# Patient Record
Sex: Female | Born: 1960 | Race: Black or African American | Hispanic: No | Marital: Single | Smoking: Never smoker
Health system: Southern US, Community
[De-identification: ages and names within clinical notes are randomized; demographics above are authoritative.]

## PROBLEM LIST (undated history)

## (undated) DIAGNOSIS — C50919 Malignant neoplasm of unspecified site of unspecified female breast: Secondary | ICD-10-CM

## (undated) DIAGNOSIS — G629 Polyneuropathy, unspecified: Secondary | ICD-10-CM

## (undated) HISTORY — DX: Malignant neoplasm of unspecified site of unspecified female breast: C50.919

## (undated) HISTORY — PX: BREAST LUMPECTOMY: SHX2

## (undated) HISTORY — PX: PORTACATH PLACEMENT: SHX2246

## (undated) HISTORY — DX: Polyneuropathy, unspecified: G62.9

---

## 2001-08-12 ENCOUNTER — Encounter: Payer: Self-pay | Admitting: *Deleted

## 2001-08-12 ENCOUNTER — Encounter: Admission: RE | Admit: 2001-08-12 | Discharge: 2001-08-12 | Payer: Self-pay | Admitting: *Deleted

## 2007-07-07 ENCOUNTER — Encounter: Admission: RE | Admit: 2007-07-07 | Discharge: 2007-07-07 | Payer: Self-pay | Admitting: Obstetrics and Gynecology

## 2007-07-21 ENCOUNTER — Encounter: Admission: RE | Admit: 2007-07-21 | Discharge: 2007-07-21 | Payer: Self-pay | Admitting: Obstetrics and Gynecology

## 2008-01-19 ENCOUNTER — Encounter: Admission: RE | Admit: 2008-01-19 | Discharge: 2008-01-19 | Payer: Self-pay | Admitting: Obstetrics and Gynecology

## 2008-12-20 ENCOUNTER — Encounter: Admission: RE | Admit: 2008-12-20 | Discharge: 2008-12-20 | Payer: Self-pay | Admitting: Obstetrics and Gynecology

## 2009-10-22 HISTORY — PX: BREAST LUMPECTOMY: SHX2

## 2010-01-02 ENCOUNTER — Encounter: Admission: RE | Admit: 2010-01-02 | Discharge: 2010-01-02 | Payer: Self-pay | Admitting: Obstetrics and Gynecology

## 2010-07-22 DIAGNOSIS — C50919 Malignant neoplasm of unspecified site of unspecified female breast: Secondary | ICD-10-CM

## 2010-07-22 HISTORY — DX: Malignant neoplasm of unspecified site of unspecified female breast: C50.919

## 2010-09-04 ENCOUNTER — Encounter: Admission: RE | Admit: 2010-09-04 | Discharge: 2010-09-04 | Payer: Self-pay | Admitting: Obstetrics and Gynecology

## 2010-09-04 ENCOUNTER — Other Ambulatory Visit: Payer: Self-pay | Admitting: Diagnostic Radiology

## 2010-09-06 ENCOUNTER — Ambulatory Visit: Payer: Self-pay | Admitting: Oncology

## 2010-09-11 ENCOUNTER — Encounter: Admission: RE | Admit: 2010-09-11 | Discharge: 2010-09-11 | Payer: Self-pay | Admitting: Obstetrics and Gynecology

## 2010-09-18 LAB — CBC WITH DIFFERENTIAL/PLATELET
BASO%: 0.2 % (ref 0.0–2.0)
Eosinophils Absolute: 0 10*3/uL (ref 0.0–0.5)
MCHC: 34.2 g/dL (ref 31.5–36.0)
MONO#: 0.3 10*3/uL (ref 0.1–0.9)
NEUT#: 2.5 10*3/uL (ref 1.5–6.5)
Platelets: 155 10*3/uL (ref 145–400)
RBC: 4.16 10*6/uL (ref 3.70–5.45)
WBC: 3.4 10*3/uL — ABNORMAL LOW (ref 3.9–10.3)
lymph#: 0.6 10*3/uL — ABNORMAL LOW (ref 0.9–3.3)

## 2010-09-18 LAB — BASIC METABOLIC PANEL
CO2: 25 mEq/L (ref 19–32)
Chloride: 104 mEq/L (ref 96–112)
Glucose, Bld: 121 mg/dL — ABNORMAL HIGH (ref 70–99)
Potassium: 3.7 mEq/L (ref 3.5–5.3)
Sodium: 140 mEq/L (ref 135–145)

## 2010-09-21 ENCOUNTER — Ambulatory Visit (HOSPITAL_BASED_OUTPATIENT_CLINIC_OR_DEPARTMENT_OTHER)
Admission: RE | Admit: 2010-09-21 | Discharge: 2010-09-21 | Payer: Self-pay | Source: Home / Self Care | Admitting: General Surgery

## 2010-09-22 ENCOUNTER — Encounter: Payer: Self-pay | Admitting: Cardiology

## 2010-09-22 ENCOUNTER — Encounter: Payer: Self-pay | Admitting: Oncology

## 2010-09-22 ENCOUNTER — Ambulatory Visit
Admission: RE | Admit: 2010-09-22 | Discharge: 2010-09-22 | Payer: Self-pay | Source: Home / Self Care | Admitting: Oncology

## 2010-09-22 ENCOUNTER — Ambulatory Visit: Payer: Self-pay

## 2010-09-26 ENCOUNTER — Ambulatory Visit: Admit: 2010-09-26 | Payer: Self-pay | Admitting: Oncology

## 2010-10-02 ENCOUNTER — Ambulatory Visit: Payer: Self-pay | Admitting: Genetic Counselor

## 2010-10-02 LAB — CBC WITH DIFFERENTIAL/PLATELET
BASO%: 1.2 % (ref 0.0–2.0)
Basophils Absolute: 0 10*3/uL (ref 0.0–0.1)
EOS%: 3.2 % (ref 0.0–7.0)
HGB: 11.7 g/dL (ref 11.6–15.9)
MCH: 29.6 pg (ref 25.1–34.0)
MCHC: 34.4 g/dL (ref 31.5–36.0)
MCV: 86.2 fL (ref 79.5–101.0)
MONO%: 7.6 % (ref 0.0–14.0)
RBC: 3.94 10*6/uL (ref 3.70–5.45)
RDW: 13.9 % (ref 11.2–14.5)
lymph#: 0.3 10*3/uL — ABNORMAL LOW (ref 0.9–3.3)

## 2010-10-02 LAB — BASIC METABOLIC PANEL
Chloride: 102 mEq/L (ref 96–112)
Potassium: 5.5 mEq/L — ABNORMAL HIGH (ref 3.5–5.3)
Sodium: 138 mEq/L (ref 135–145)

## 2010-10-09 ENCOUNTER — Ambulatory Visit: Payer: Self-pay | Admitting: Oncology

## 2010-10-09 LAB — CBC WITH DIFFERENTIAL/PLATELET
Basophils Absolute: 0.1 10*3/uL (ref 0.0–0.1)
EOS%: 0.1 % (ref 0.0–7.0)
Eosinophils Absolute: 0 10*3/uL (ref 0.0–0.5)
HGB: 12.1 g/dL (ref 11.6–15.9)
MCV: 84.7 fL (ref 79.5–101.0)
MONO%: 8.6 % (ref 0.0–14.0)
NEUT#: 8.5 10*3/uL — ABNORMAL HIGH (ref 1.5–6.5)
RBC: 4.19 10*6/uL (ref 3.70–5.45)
RDW: 13.9 % (ref 11.2–14.5)
lymph#: 1 10*3/uL (ref 0.9–3.3)
nRBC: 0 % (ref 0–0)

## 2010-10-10 LAB — COMPREHENSIVE METABOLIC PANEL
Albumin: 4 g/dL (ref 3.5–5.2)
Alkaline Phosphatase: 67 U/L (ref 39–117)
BUN: 13 mg/dL (ref 6–23)
CO2: 24 mEq/L (ref 19–32)
Calcium: 9.2 mg/dL (ref 8.4–10.5)
Chloride: 104 mEq/L (ref 96–112)
Glucose, Bld: 88 mg/dL (ref 70–99)
Potassium: 3.5 mEq/L (ref 3.5–5.3)
Sodium: 137 mEq/L (ref 135–145)
Total Protein: 6.7 g/dL (ref 6.0–8.3)

## 2010-10-24 LAB — CBC WITH DIFFERENTIAL/PLATELET
Basophils Absolute: 0 10*3/uL (ref 0.0–0.1)
EOS%: 0 % (ref 0.0–7.0)
Eosinophils Absolute: 0 10*3/uL (ref 0.0–0.5)
HGB: 10.8 g/dL — ABNORMAL LOW (ref 11.6–15.9)
NEUT#: 7.4 10*3/uL — ABNORMAL HIGH (ref 1.5–6.5)
RDW: 14.8 % — ABNORMAL HIGH (ref 11.2–14.5)
lymph#: 0.7 10*3/uL — ABNORMAL LOW (ref 0.9–3.3)

## 2010-10-24 LAB — COMPREHENSIVE METABOLIC PANEL
ALT: 12 U/L (ref 0–35)
AST: 17 U/L (ref 0–37)
Albumin: 3.9 g/dL (ref 3.5–5.2)
Alkaline Phosphatase: 63 U/L (ref 39–117)
BUN: 6 mg/dL (ref 6–23)
CO2: 29 mEq/L (ref 19–32)
Calcium: 9.1 mg/dL (ref 8.4–10.5)
Chloride: 105 mEq/L (ref 96–112)
Creatinine, Ser: 0.6 mg/dL (ref 0.40–1.20)
Glucose, Bld: 104 mg/dL — ABNORMAL HIGH (ref 70–99)
Potassium: 3.8 mEq/L (ref 3.5–5.3)
Sodium: 140 mEq/L (ref 135–145)
Total Bilirubin: 0.5 mg/dL (ref 0.3–1.2)
Total Protein: 6.7 g/dL (ref 6.0–8.3)

## 2010-10-30 LAB — CBC WITH DIFFERENTIAL/PLATELET
BASO%: 0.1 % (ref 0.0–2.0)
Basophils Absolute: 0 10*3/uL (ref 0.0–0.1)
EOS%: 0.2 % (ref 0.0–7.0)
Eosinophils Absolute: 0 10*3/uL (ref 0.0–0.5)
HCT: 31.3 % — ABNORMAL LOW (ref 34.8–46.6)
HGB: 10.7 g/dL — ABNORMAL LOW (ref 11.6–15.9)
LYMPH%: 6.2 % — ABNORMAL LOW (ref 14.0–49.7)
MCH: 29.5 pg (ref 25.1–34.0)
MCHC: 34.1 g/dL (ref 31.5–36.0)
MCV: 86.6 fL (ref 79.5–101.0)
MONO#: 0 10*3/uL — ABNORMAL LOW (ref 0.1–0.9)
MONO%: 0.2 % (ref 0.0–14.0)
NEUT#: 2.5 10*3/uL (ref 1.5–6.5)
NEUT%: 93.3 % — ABNORMAL HIGH (ref 38.4–76.8)
Platelets: 129 10*3/uL — ABNORMAL LOW (ref 145–400)
RBC: 3.61 10*6/uL — ABNORMAL LOW (ref 3.70–5.45)
RDW: 15.5 % — ABNORMAL HIGH (ref 11.2–14.5)
WBC: 2.7 10*3/uL — ABNORMAL LOW (ref 3.9–10.3)
lymph#: 0.2 10*3/uL — ABNORMAL LOW (ref 0.9–3.3)

## 2010-10-30 LAB — BASIC METABOLIC PANEL
BUN: 11 mg/dL (ref 6–23)
CO2: 25 mEq/L (ref 19–32)
Calcium: 9.4 mg/dL (ref 8.4–10.5)
Chloride: 103 mEq/L (ref 96–112)
Creatinine, Ser: 0.6 mg/dL (ref 0.40–1.20)
Glucose, Bld: 53 mg/dL — ABNORMAL LOW (ref 70–99)
Potassium: 3.7 mEq/L (ref 3.5–5.3)
Sodium: 139 mEq/L (ref 135–145)

## 2010-11-06 LAB — COMPREHENSIVE METABOLIC PANEL
ALT: 9 U/L (ref 0–35)
AST: 15 U/L (ref 0–37)
Albumin: 4.4 g/dL (ref 3.5–5.2)
Alkaline Phosphatase: 81 U/L (ref 39–117)
BUN: 10 mg/dL (ref 6–23)
CO2: 27 mEq/L (ref 19–32)
Calcium: 9.2 mg/dL (ref 8.4–10.5)
Chloride: 104 mEq/L (ref 96–112)
Creatinine, Ser: 0.54 mg/dL (ref 0.40–1.20)
Glucose, Bld: 97 mg/dL (ref 70–99)
Potassium: 4.1 mEq/L (ref 3.5–5.3)
Sodium: 137 mEq/L (ref 135–145)
Total Bilirubin: 0.4 mg/dL (ref 0.3–1.2)
Total Protein: 7.1 g/dL (ref 6.0–8.3)

## 2010-11-06 LAB — CBC WITH DIFFERENTIAL/PLATELET
BASO%: 0.4 % (ref 0.0–2.0)
Basophils Absolute: 0 10*3/uL (ref 0.0–0.1)
EOS%: 0.1 % (ref 0.0–7.0)
Eosinophils Absolute: 0 10*3/uL (ref 0.0–0.5)
HCT: 31.5 % — ABNORMAL LOW (ref 34.8–46.6)
HGB: 10.7 g/dL — ABNORMAL LOW (ref 11.6–15.9)
LYMPH%: 12.4 % — ABNORMAL LOW (ref 14.0–49.7)
MCH: 29.2 pg (ref 25.1–34.0)
MCHC: 34 g/dL (ref 31.5–36.0)
MCV: 85.8 fL (ref 79.5–101.0)
MONO#: 0.4 10*3/uL (ref 0.1–0.9)
MONO%: 4.7 % (ref 0.0–14.0)
NEUT#: 7.5 10*3/uL — ABNORMAL HIGH (ref 1.5–6.5)
NEUT%: 82.4 % — ABNORMAL HIGH (ref 38.4–76.8)
Platelets: 102 10*3/uL — ABNORMAL LOW (ref 145–400)
RBC: 3.67 10*6/uL — ABNORMAL LOW (ref 3.70–5.45)
RDW: 16.4 % — ABNORMAL HIGH (ref 11.2–14.5)
WBC: 9.1 10*3/uL (ref 3.9–10.3)
lymph#: 1.1 10*3/uL (ref 0.9–3.3)
nRBC: 0 % (ref 0–0)

## 2010-11-09 ENCOUNTER — Ambulatory Visit (HOSPITAL_BASED_OUTPATIENT_CLINIC_OR_DEPARTMENT_OTHER): Payer: BC Managed Care – PPO | Admitting: Oncology

## 2010-11-11 ENCOUNTER — Encounter: Payer: Self-pay | Admitting: Obstetrics and Gynecology

## 2010-11-13 ENCOUNTER — Encounter: Payer: Self-pay | Admitting: Obstetrics and Gynecology

## 2010-11-13 LAB — COMPREHENSIVE METABOLIC PANEL
Albumin: 4.6 g/dL (ref 3.5–5.2)
BUN: 12 mg/dL (ref 6–23)
CO2: 26 mEq/L (ref 19–32)
Calcium: 9.8 mg/dL (ref 8.4–10.5)
Chloride: 104 mEq/L (ref 96–112)
Glucose, Bld: 104 mg/dL — ABNORMAL HIGH (ref 70–99)
Potassium: 4.6 mEq/L (ref 3.5–5.3)

## 2010-11-13 LAB — CBC WITH DIFFERENTIAL/PLATELET
Basophils Absolute: 0 10*3/uL (ref 0.0–0.1)
Eosinophils Absolute: 0 10*3/uL (ref 0.0–0.5)
HGB: 10 g/dL — ABNORMAL LOW (ref 11.6–15.9)
MCV: 86.7 fL (ref 79.5–101.0)
MONO#: 0.1 10*3/uL (ref 0.1–0.9)
NEUT#: 1.9 10*3/uL (ref 1.5–6.5)
RDW: 17.6 % — ABNORMAL HIGH (ref 11.2–14.5)
lymph#: 0.2 10*3/uL — ABNORMAL LOW (ref 0.9–3.3)

## 2010-11-20 LAB — BASIC METABOLIC PANEL
BUN: 11 mg/dL (ref 6–23)
CO2: 25 mEq/L (ref 19–32)
Glucose, Bld: 94 mg/dL (ref 70–99)
Potassium: 3.7 mEq/L (ref 3.5–5.3)

## 2010-11-20 LAB — CBC WITH DIFFERENTIAL/PLATELET
Basophils Absolute: 0.1 10*3/uL (ref 0.0–0.1)
Eosinophils Absolute: 0 10*3/uL (ref 0.0–0.5)
HGB: 10.7 g/dL — ABNORMAL LOW (ref 11.6–15.9)
MCV: 87.4 fL (ref 79.5–101.0)
MONO#: 0.4 10*3/uL (ref 0.1–0.9)
MONO%: 3.7 % (ref 0.0–14.0)
NEUT#: 9.6 10*3/uL — ABNORMAL HIGH (ref 1.5–6.5)
Platelets: 111 10*3/uL — ABNORMAL LOW (ref 145–400)
RDW: 18.3 % — ABNORMAL HIGH (ref 11.2–14.5)
WBC: 11.5 10*3/uL — ABNORMAL HIGH (ref 3.9–10.3)

## 2010-11-21 ENCOUNTER — Other Ambulatory Visit: Payer: Self-pay | Admitting: Oncology

## 2010-11-21 DIAGNOSIS — C50912 Malignant neoplasm of unspecified site of left female breast: Secondary | ICD-10-CM

## 2010-11-22 ENCOUNTER — Encounter: Payer: Self-pay | Admitting: Oncology

## 2010-11-22 ENCOUNTER — Other Ambulatory Visit: Payer: Self-pay

## 2010-11-25 ENCOUNTER — Encounter: Payer: Self-pay | Admitting: Oncology

## 2010-11-26 ENCOUNTER — Ambulatory Visit
Admission: RE | Admit: 2010-11-26 | Discharge: 2010-11-26 | Disposition: A | Discharge status: Home / Self Care | Payer: BC Managed Care – PPO | Source: Ambulatory Visit | Attending: Oncology | Admitting: Oncology

## 2010-11-26 DIAGNOSIS — C50912 Malignant neoplasm of unspecified site of left female breast: Secondary | ICD-10-CM

## 2010-11-26 MED ORDER — GADOBENATE DIMEGLUMINE 529 MG/ML IV SOLN
13.0000 mL | Freq: Once | INTRAVENOUS | Status: AC | PRN
  Administered 2010-11-26: 13 mL via INTRAVENOUS

## 2010-11-27 ENCOUNTER — Encounter (HOSPITAL_BASED_OUTPATIENT_CLINIC_OR_DEPARTMENT_OTHER): Payer: BC Managed Care – PPO | Admitting: Oncology

## 2010-11-27 DIAGNOSIS — D509 Iron deficiency anemia, unspecified: Secondary | ICD-10-CM

## 2010-11-27 DIAGNOSIS — C50419 Malignant neoplasm of upper-outer quadrant of unspecified female breast: Secondary | ICD-10-CM

## 2010-11-27 DIAGNOSIS — Z5112 Encounter for antineoplastic immunotherapy: Secondary | ICD-10-CM

## 2010-11-27 DIAGNOSIS — Z5189 Encounter for other specified aftercare: Secondary | ICD-10-CM

## 2010-11-27 DIAGNOSIS — Z5111 Encounter for antineoplastic chemotherapy: Secondary | ICD-10-CM

## 2010-11-27 LAB — CBC WITH DIFFERENTIAL/PLATELET
BASO%: 0.1 % (ref 0.0–2.0)
EOS%: 0.3 % (ref 0.0–7.0)
HCT: 28.3 % — ABNORMAL LOW (ref 34.8–46.6)
LYMPH%: 9 % — ABNORMAL LOW (ref 14.0–49.7)
MCH: 30.8 pg (ref 25.1–34.0)
MCHC: 34.9 g/dL (ref 31.5–36.0)
MCV: 88.1 fL (ref 79.5–101.0)
MONO#: 0.2 10*3/uL (ref 0.1–0.9)
MONO%: 3.4 % (ref 0.0–14.0)
NEUT%: 87.2 % — ABNORMAL HIGH (ref 38.4–76.8)
Platelets: 180 10*3/uL (ref 145–400)
RBC: 3.21 10*6/uL — ABNORMAL LOW (ref 3.70–5.45)
WBC: 4.6 10*3/uL (ref 3.9–10.3)

## 2010-11-27 LAB — BASIC METABOLIC PANEL
Calcium: 9.2 mg/dL (ref 8.4–10.5)
Creatinine, Ser: 0.49 mg/dL (ref 0.40–1.20)
Sodium: 138 mEq/L (ref 135–145)

## 2010-11-30 ENCOUNTER — Other Ambulatory Visit: Payer: Self-pay | Admitting: Obstetrics and Gynecology

## 2010-11-30 DIAGNOSIS — Z853 Personal history of malignant neoplasm of breast: Secondary | ICD-10-CM

## 2010-12-04 ENCOUNTER — Other Ambulatory Visit: Payer: Self-pay | Admitting: Oncology

## 2010-12-04 ENCOUNTER — Encounter (HOSPITAL_BASED_OUTPATIENT_CLINIC_OR_DEPARTMENT_OTHER): Payer: BC Managed Care – PPO | Admitting: Oncology

## 2010-12-04 DIAGNOSIS — Z5189 Encounter for other specified aftercare: Secondary | ICD-10-CM

## 2010-12-04 DIAGNOSIS — Z5111 Encounter for antineoplastic chemotherapy: Secondary | ICD-10-CM

## 2010-12-04 DIAGNOSIS — C50419 Malignant neoplasm of upper-outer quadrant of unspecified female breast: Secondary | ICD-10-CM

## 2010-12-04 DIAGNOSIS — D509 Iron deficiency anemia, unspecified: Secondary | ICD-10-CM

## 2010-12-04 DIAGNOSIS — Z5112 Encounter for antineoplastic immunotherapy: Secondary | ICD-10-CM

## 2010-12-04 LAB — CBC WITH DIFFERENTIAL/PLATELET
BASO%: 0.7 % (ref 0.0–2.0)
Basophils Absolute: 0 10*3/uL (ref 0.0–0.1)
EOS%: 0.3 % (ref 0.0–7.0)
HCT: 31.4 % — ABNORMAL LOW (ref 34.8–46.6)
HGB: 10.2 g/dL — ABNORMAL LOW (ref 11.6–15.9)
MCH: 28.4 pg (ref 25.1–34.0)
MONO#: 0.3 10*3/uL (ref 0.1–0.9)
NEUT%: 70.2 % (ref 38.4–76.8)
RDW: 17.2 % — ABNORMAL HIGH (ref 11.2–14.5)
WBC: 2.9 10*3/uL — ABNORMAL LOW (ref 3.9–10.3)
lymph#: 0.6 10*3/uL — ABNORMAL LOW (ref 0.9–3.3)

## 2010-12-04 LAB — BASIC METABOLIC PANEL
BUN: 11 mg/dL (ref 6–23)
Chloride: 106 mEq/L (ref 96–112)
Glucose, Bld: 97 mg/dL (ref 70–99)
Potassium: 3.9 mEq/L (ref 3.5–5.3)
Sodium: 139 mEq/L (ref 135–145)

## 2010-12-11 ENCOUNTER — Other Ambulatory Visit: Payer: Self-pay | Admitting: Oncology

## 2010-12-11 ENCOUNTER — Ambulatory Visit (HOSPITAL_COMMUNITY)
Admission: RE | Admit: 2010-12-11 | Discharge: 2010-12-11 | Disposition: A | Discharge status: Home / Self Care | Payer: BC Managed Care – PPO | Source: Ambulatory Visit | Attending: Oncology | Admitting: Oncology

## 2010-12-11 ENCOUNTER — Encounter (HOSPITAL_BASED_OUTPATIENT_CLINIC_OR_DEPARTMENT_OTHER): Payer: BC Managed Care – PPO | Admitting: Oncology

## 2010-12-11 DIAGNOSIS — D509 Iron deficiency anemia, unspecified: Secondary | ICD-10-CM

## 2010-12-11 DIAGNOSIS — R51 Headache: Secondary | ICD-10-CM

## 2010-12-11 DIAGNOSIS — Z5112 Encounter for antineoplastic immunotherapy: Secondary | ICD-10-CM

## 2010-12-11 DIAGNOSIS — Z5189 Encounter for other specified aftercare: Secondary | ICD-10-CM

## 2010-12-11 DIAGNOSIS — C50419 Malignant neoplasm of upper-outer quadrant of unspecified female breast: Secondary | ICD-10-CM

## 2010-12-11 DIAGNOSIS — Z5111 Encounter for antineoplastic chemotherapy: Secondary | ICD-10-CM

## 2010-12-11 LAB — CBC WITH DIFFERENTIAL/PLATELET
Eosinophils Absolute: 0 10*3/uL (ref 0.0–0.5)
HCT: 28.5 % — ABNORMAL LOW (ref 34.8–46.6)
LYMPH%: 12.6 % — ABNORMAL LOW (ref 14.0–49.7)
MCV: 87.3 fL (ref 79.5–101.0)
MONO#: 0.2 10*3/uL (ref 0.1–0.9)
MONO%: 6.6 % (ref 0.0–14.0)
NEUT#: 2 10*3/uL (ref 1.5–6.5)
NEUT%: 79.2 % — ABNORMAL HIGH (ref 38.4–76.8)
Platelets: 126 10*3/uL — ABNORMAL LOW (ref 145–400)
WBC: 2.5 10*3/uL — ABNORMAL LOW (ref 3.9–10.3)

## 2010-12-11 LAB — BASIC METABOLIC PANEL
BUN: 14 mg/dL (ref 6–23)
CO2: 26 mEq/L (ref 19–32)
Calcium: 9.4 mg/dL (ref 8.4–10.5)
Creatinine, Ser: 0.54 mg/dL (ref 0.40–1.20)
Glucose, Bld: 101 mg/dL — ABNORMAL HIGH (ref 70–99)

## 2010-12-18 ENCOUNTER — Encounter (HOSPITAL_BASED_OUTPATIENT_CLINIC_OR_DEPARTMENT_OTHER): Payer: BC Managed Care – PPO | Admitting: Oncology

## 2010-12-18 ENCOUNTER — Other Ambulatory Visit: Payer: Self-pay | Admitting: Oncology

## 2010-12-18 DIAGNOSIS — Z5111 Encounter for antineoplastic chemotherapy: Secondary | ICD-10-CM

## 2010-12-18 DIAGNOSIS — D509 Iron deficiency anemia, unspecified: Secondary | ICD-10-CM

## 2010-12-18 DIAGNOSIS — Z5112 Encounter for antineoplastic immunotherapy: Secondary | ICD-10-CM

## 2010-12-18 DIAGNOSIS — Z5189 Encounter for other specified aftercare: Secondary | ICD-10-CM

## 2010-12-18 DIAGNOSIS — C50419 Malignant neoplasm of upper-outer quadrant of unspecified female breast: Secondary | ICD-10-CM

## 2010-12-18 LAB — CBC WITH DIFFERENTIAL/PLATELET
Basophils Absolute: 0 10*3/uL (ref 0.0–0.1)
Eosinophils Absolute: 0 10*3/uL (ref 0.0–0.5)
HCT: 28.7 % — ABNORMAL LOW (ref 34.8–46.6)
HGB: 9.7 g/dL — ABNORMAL LOW (ref 11.6–15.9)
LYMPH%: 13.5 % — ABNORMAL LOW (ref 14.0–49.7)
MCH: 29.3 pg (ref 25.1–34.0)
MCV: 86.7 fL (ref 79.5–101.0)
MONO%: 4.7 % (ref 0.0–14.0)
NEUT#: 2.6 10*3/uL (ref 1.5–6.5)
NEUT%: 79.9 % — ABNORMAL HIGH (ref 38.4–76.8)
Platelets: 131 10*3/uL — ABNORMAL LOW (ref 145–400)
RDW: 16.5 % — ABNORMAL HIGH (ref 11.2–14.5)

## 2010-12-18 LAB — BASIC METABOLIC PANEL
BUN: 12 mg/dL (ref 6–23)
CO2: 25 mEq/L (ref 19–32)
Calcium: 9.2 mg/dL (ref 8.4–10.5)
Glucose, Bld: 100 mg/dL — ABNORMAL HIGH (ref 70–99)
Sodium: 139 mEq/L (ref 135–145)

## 2010-12-25 ENCOUNTER — Other Ambulatory Visit: Payer: Self-pay | Admitting: Oncology

## 2010-12-25 ENCOUNTER — Encounter (HOSPITAL_BASED_OUTPATIENT_CLINIC_OR_DEPARTMENT_OTHER): Payer: BC Managed Care – PPO | Admitting: Oncology

## 2010-12-25 DIAGNOSIS — Z5189 Encounter for other specified aftercare: Secondary | ICD-10-CM

## 2010-12-25 DIAGNOSIS — Z171 Estrogen receptor negative status [ER-]: Secondary | ICD-10-CM

## 2010-12-25 DIAGNOSIS — Z5112 Encounter for antineoplastic immunotherapy: Secondary | ICD-10-CM

## 2010-12-25 DIAGNOSIS — Z5111 Encounter for antineoplastic chemotherapy: Secondary | ICD-10-CM

## 2010-12-25 DIAGNOSIS — C50419 Malignant neoplasm of upper-outer quadrant of unspecified female breast: Secondary | ICD-10-CM

## 2010-12-25 DIAGNOSIS — D509 Iron deficiency anemia, unspecified: Secondary | ICD-10-CM

## 2010-12-25 LAB — BASIC METABOLIC PANEL
Chloride: 105 mEq/L (ref 96–112)
Glucose, Bld: 99 mg/dL (ref 70–99)
Potassium: 3.9 mEq/L (ref 3.5–5.3)
Sodium: 138 mEq/L (ref 135–145)

## 2010-12-25 LAB — CBC WITH DIFFERENTIAL/PLATELET
BASO%: 0.6 % (ref 0.0–2.0)
EOS%: 0.6 % (ref 0.0–7.0)
HCT: 28 % — ABNORMAL LOW (ref 34.8–46.6)
LYMPH%: 21.5 % (ref 14.0–49.7)
MCH: 28.8 pg (ref 25.1–34.0)
MCHC: 33.6 g/dL (ref 31.5–36.0)
NEUT%: 72.9 % (ref 38.4–76.8)
RBC: 3.26 10*6/uL — ABNORMAL LOW (ref 3.70–5.45)
lymph#: 0.3 10*3/uL — ABNORMAL LOW (ref 0.9–3.3)

## 2011-01-01 ENCOUNTER — Other Ambulatory Visit: Payer: Self-pay | Admitting: Oncology

## 2011-01-01 ENCOUNTER — Encounter (HOSPITAL_BASED_OUTPATIENT_CLINIC_OR_DEPARTMENT_OTHER): Payer: BC Managed Care – PPO | Admitting: Oncology

## 2011-01-01 DIAGNOSIS — Z5112 Encounter for antineoplastic immunotherapy: Secondary | ICD-10-CM

## 2011-01-01 DIAGNOSIS — Z5189 Encounter for other specified aftercare: Secondary | ICD-10-CM

## 2011-01-01 DIAGNOSIS — Z452 Encounter for adjustment and management of vascular access device: Secondary | ICD-10-CM

## 2011-01-01 DIAGNOSIS — Z5111 Encounter for antineoplastic chemotherapy: Secondary | ICD-10-CM

## 2011-01-01 DIAGNOSIS — D509 Iron deficiency anemia, unspecified: Secondary | ICD-10-CM

## 2011-01-01 DIAGNOSIS — C50419 Malignant neoplasm of upper-outer quadrant of unspecified female breast: Secondary | ICD-10-CM

## 2011-01-01 LAB — COMPREHENSIVE METABOLIC PANEL
Alkaline Phosphatase: 45 U/L (ref 39–117)
CO2: 21 mEq/L (ref 19–32)
Creatinine, Ser: 0.42 mg/dL (ref 0.40–1.20)
Glucose, Bld: 120 mg/dL — ABNORMAL HIGH (ref 70–99)
Total Bilirubin: 0.9 mg/dL (ref 0.3–1.2)

## 2011-01-01 LAB — CBC WITH DIFFERENTIAL/PLATELET
BASO%: 0.5 % (ref 0.0–2.0)
Eosinophils Absolute: 0 10*3/uL (ref 0.0–0.5)
HCT: 28.4 % — ABNORMAL LOW (ref 34.8–46.6)
LYMPH%: 15.2 % (ref 14.0–49.7)
MCHC: 33.1 g/dL (ref 31.5–36.0)
MCV: 85.8 fL (ref 79.5–101.0)
MONO#: 0.3 10*3/uL (ref 0.1–0.9)
MONO%: 15.7 % — ABNORMAL HIGH (ref 0.0–14.0)
NEUT%: 67.6 % (ref 38.4–76.8)
Platelets: 118 10*3/uL — ABNORMAL LOW (ref 145–400)
WBC: 2.1 10*3/uL — ABNORMAL LOW (ref 3.9–10.3)

## 2011-01-02 LAB — POCT HEMOGLOBIN-HEMACUE: Hemoglobin: 14.1 g/dL (ref 12.0–15.0)

## 2011-01-03 ENCOUNTER — Encounter (HOSPITAL_BASED_OUTPATIENT_CLINIC_OR_DEPARTMENT_OTHER): Payer: BC Managed Care – PPO | Admitting: Oncology

## 2011-01-03 DIAGNOSIS — C50419 Malignant neoplasm of upper-outer quadrant of unspecified female breast: Secondary | ICD-10-CM

## 2011-01-08 ENCOUNTER — Encounter (HOSPITAL_BASED_OUTPATIENT_CLINIC_OR_DEPARTMENT_OTHER): Payer: BC Managed Care – PPO | Admitting: Oncology

## 2011-01-08 ENCOUNTER — Other Ambulatory Visit: Payer: Self-pay | Admitting: Oncology

## 2011-01-08 DIAGNOSIS — Z5112 Encounter for antineoplastic immunotherapy: Secondary | ICD-10-CM

## 2011-01-08 DIAGNOSIS — Z5111 Encounter for antineoplastic chemotherapy: Secondary | ICD-10-CM

## 2011-01-08 DIAGNOSIS — C50419 Malignant neoplasm of upper-outer quadrant of unspecified female breast: Secondary | ICD-10-CM

## 2011-01-08 LAB — CBC WITH DIFFERENTIAL/PLATELET
BASO%: 0.6 % (ref 0.0–2.0)
Basophils Absolute: 0 10*3/uL (ref 0.0–0.1)
Eosinophils Absolute: 0 10*3/uL (ref 0.0–0.5)
HCT: 30 % — ABNORMAL LOW (ref 34.8–46.6)
HGB: 9.9 g/dL — ABNORMAL LOW (ref 11.6–15.9)
MONO#: 0.5 10*3/uL (ref 0.1–0.9)
NEUT#: 2.4 10*3/uL (ref 1.5–6.5)
NEUT%: 70.4 % (ref 38.4–76.8)
WBC: 3.4 10*3/uL — ABNORMAL LOW (ref 3.9–10.3)
lymph#: 0.4 10*3/uL — ABNORMAL LOW (ref 0.9–3.3)

## 2011-01-08 LAB — COMPREHENSIVE METABOLIC PANEL
AST: 20 U/L (ref 0–37)
Albumin: 4.1 g/dL (ref 3.5–5.2)
BUN: 12 mg/dL (ref 6–23)
CO2: 26 mEq/L (ref 19–32)
Calcium: 9.5 mg/dL (ref 8.4–10.5)
Chloride: 105 mEq/L (ref 96–112)
Glucose, Bld: 90 mg/dL (ref 70–99)
Potassium: 3.8 mEq/L (ref 3.5–5.3)

## 2011-01-09 ENCOUNTER — Encounter (HOSPITAL_BASED_OUTPATIENT_CLINIC_OR_DEPARTMENT_OTHER): Payer: BC Managed Care – PPO | Admitting: Oncology

## 2011-01-09 DIAGNOSIS — C50419 Malignant neoplasm of upper-outer quadrant of unspecified female breast: Secondary | ICD-10-CM

## 2011-01-09 DIAGNOSIS — Z5189 Encounter for other specified aftercare: Secondary | ICD-10-CM

## 2011-01-10 ENCOUNTER — Encounter (HOSPITAL_BASED_OUTPATIENT_CLINIC_OR_DEPARTMENT_OTHER): Payer: BC Managed Care – PPO | Admitting: Oncology

## 2011-01-10 DIAGNOSIS — C50419 Malignant neoplasm of upper-outer quadrant of unspecified female breast: Secondary | ICD-10-CM

## 2011-01-10 DIAGNOSIS — Z5189 Encounter for other specified aftercare: Secondary | ICD-10-CM

## 2011-01-11 ENCOUNTER — Encounter (HOSPITAL_BASED_OUTPATIENT_CLINIC_OR_DEPARTMENT_OTHER): Payer: BC Managed Care – PPO | Admitting: Oncology

## 2011-01-11 DIAGNOSIS — Z5189 Encounter for other specified aftercare: Secondary | ICD-10-CM

## 2011-01-11 DIAGNOSIS — C50419 Malignant neoplasm of upper-outer quadrant of unspecified female breast: Secondary | ICD-10-CM

## 2011-01-15 ENCOUNTER — Other Ambulatory Visit: Payer: Self-pay | Admitting: Oncology

## 2011-01-15 ENCOUNTER — Encounter (HOSPITAL_BASED_OUTPATIENT_CLINIC_OR_DEPARTMENT_OTHER): Payer: BC Managed Care – PPO | Admitting: Oncology

## 2011-01-15 DIAGNOSIS — Z5112 Encounter for antineoplastic immunotherapy: Secondary | ICD-10-CM

## 2011-01-15 DIAGNOSIS — Z5111 Encounter for antineoplastic chemotherapy: Secondary | ICD-10-CM

## 2011-01-15 DIAGNOSIS — C50419 Malignant neoplasm of upper-outer quadrant of unspecified female breast: Secondary | ICD-10-CM

## 2011-01-15 LAB — COMPREHENSIVE METABOLIC PANEL
ALT: 49 U/L — ABNORMAL HIGH (ref 0–35)
AST: 41 U/L — ABNORMAL HIGH (ref 0–37)
Albumin: 4.2 g/dL (ref 3.5–5.2)
Alkaline Phosphatase: 84 U/L (ref 39–117)
Glucose, Bld: 94 mg/dL (ref 70–99)
Potassium: 4 mEq/L (ref 3.5–5.3)
Sodium: 140 mEq/L (ref 135–145)
Total Protein: 7.1 g/dL (ref 6.0–8.3)

## 2011-01-15 LAB — CBC WITH DIFFERENTIAL/PLATELET
BASO%: 0.6 % (ref 0.0–2.0)
Basophils Absolute: 0 10*3/uL (ref 0.0–0.1)
EOS%: 0.6 % (ref 0.0–7.0)
HGB: 10.1 g/dL — ABNORMAL LOW (ref 11.6–15.9)
MCH: 27.2 pg (ref 25.1–34.0)
RDW: 15.7 % — ABNORMAL HIGH (ref 11.2–14.5)
lymph#: 0.6 10*3/uL — ABNORMAL LOW (ref 0.9–3.3)

## 2011-01-16 ENCOUNTER — Encounter (HOSPITAL_BASED_OUTPATIENT_CLINIC_OR_DEPARTMENT_OTHER): Payer: BC Managed Care – PPO | Admitting: Oncology

## 2011-01-16 DIAGNOSIS — C50419 Malignant neoplasm of upper-outer quadrant of unspecified female breast: Secondary | ICD-10-CM

## 2011-01-16 DIAGNOSIS — Z5189 Encounter for other specified aftercare: Secondary | ICD-10-CM

## 2011-01-17 ENCOUNTER — Encounter (HOSPITAL_BASED_OUTPATIENT_CLINIC_OR_DEPARTMENT_OTHER): Payer: BC Managed Care – PPO | Admitting: Oncology

## 2011-01-17 DIAGNOSIS — Z5189 Encounter for other specified aftercare: Secondary | ICD-10-CM

## 2011-01-17 DIAGNOSIS — C50419 Malignant neoplasm of upper-outer quadrant of unspecified female breast: Secondary | ICD-10-CM

## 2011-01-18 ENCOUNTER — Encounter (HOSPITAL_BASED_OUTPATIENT_CLINIC_OR_DEPARTMENT_OTHER): Payer: BC Managed Care – PPO | Admitting: Oncology

## 2011-01-18 DIAGNOSIS — Z5189 Encounter for other specified aftercare: Secondary | ICD-10-CM

## 2011-01-18 DIAGNOSIS — C50219 Malignant neoplasm of upper-inner quadrant of unspecified female breast: Secondary | ICD-10-CM

## 2011-01-19 ENCOUNTER — Ambulatory Visit: Payer: BC Managed Care – PPO | Admitting: Cardiology

## 2011-01-22 ENCOUNTER — Encounter (HOSPITAL_BASED_OUTPATIENT_CLINIC_OR_DEPARTMENT_OTHER): Payer: BC Managed Care – PPO | Admitting: Oncology

## 2011-01-22 ENCOUNTER — Other Ambulatory Visit: Payer: Self-pay | Admitting: Oncology

## 2011-01-22 DIAGNOSIS — Z5112 Encounter for antineoplastic immunotherapy: Secondary | ICD-10-CM

## 2011-01-22 DIAGNOSIS — C50419 Malignant neoplasm of upper-outer quadrant of unspecified female breast: Secondary | ICD-10-CM

## 2011-01-22 DIAGNOSIS — Z5111 Encounter for antineoplastic chemotherapy: Secondary | ICD-10-CM

## 2011-01-22 LAB — CBC WITH DIFFERENTIAL/PLATELET
Basophils Absolute: 0 10*3/uL (ref 0.0–0.1)
Eosinophils Absolute: 0 10*3/uL (ref 0.0–0.5)
HCT: 31 % — ABNORMAL LOW (ref 34.8–46.6)
HGB: 10.2 g/dL — ABNORMAL LOW (ref 11.6–15.9)
LYMPH%: 12.5 % — ABNORMAL LOW (ref 14.0–49.7)
MCV: 83.1 fL (ref 79.5–101.0)
MONO#: 0.3 10*3/uL (ref 0.1–0.9)
MONO%: 8.4 % (ref 0.0–14.0)
NEUT#: 2.9 10*3/uL (ref 1.5–6.5)
NEUT%: 78.1 % — ABNORMAL HIGH (ref 38.4–76.8)
Platelets: 152 10*3/uL (ref 145–400)
WBC: 3.7 10*3/uL — ABNORMAL LOW (ref 3.9–10.3)

## 2011-01-22 LAB — COMPREHENSIVE METABOLIC PANEL
Albumin: 4.4 g/dL (ref 3.5–5.2)
Alkaline Phosphatase: 97 U/L (ref 39–117)
BUN: 11 mg/dL (ref 6–23)
CO2: 27 mEq/L (ref 19–32)
Glucose, Bld: 100 mg/dL — ABNORMAL HIGH (ref 70–99)
Total Bilirubin: 0.7 mg/dL (ref 0.3–1.2)
Total Protein: 7.2 g/dL (ref 6.0–8.3)

## 2011-01-26 ENCOUNTER — Other Ambulatory Visit: Payer: Self-pay | Admitting: Oncology

## 2011-01-26 DIAGNOSIS — C50912 Malignant neoplasm of unspecified site of left female breast: Secondary | ICD-10-CM

## 2011-01-29 ENCOUNTER — Other Ambulatory Visit: Payer: Self-pay | Admitting: Oncology

## 2011-01-29 ENCOUNTER — Encounter (HOSPITAL_BASED_OUTPATIENT_CLINIC_OR_DEPARTMENT_OTHER): Payer: BC Managed Care – PPO | Admitting: Oncology

## 2011-01-29 ENCOUNTER — Ambulatory Visit (HOSPITAL_COMMUNITY)
Admission: RE | Admit: 2011-01-29 | Discharge: 2011-01-29 | Disposition: A | Discharge status: Home / Self Care | Payer: BC Managed Care – PPO | Source: Ambulatory Visit | Attending: Oncology | Admitting: Oncology

## 2011-01-29 DIAGNOSIS — Y849 Medical procedure, unspecified as the cause of abnormal reaction of the patient, or of later complication, without mention of misadventure at the time of the procedure: Secondary | ICD-10-CM | POA: Insufficient documentation

## 2011-01-29 DIAGNOSIS — C50919 Malignant neoplasm of unspecified site of unspecified female breast: Secondary | ICD-10-CM

## 2011-01-29 DIAGNOSIS — Z5111 Encounter for antineoplastic chemotherapy: Secondary | ICD-10-CM

## 2011-01-29 DIAGNOSIS — G609 Hereditary and idiopathic neuropathy, unspecified: Secondary | ICD-10-CM

## 2011-01-29 DIAGNOSIS — C50419 Malignant neoplasm of upper-outer quadrant of unspecified female breast: Secondary | ICD-10-CM

## 2011-01-29 DIAGNOSIS — T82898A Other specified complication of vascular prosthetic devices, implants and grafts, initial encounter: Secondary | ICD-10-CM | POA: Insufficient documentation

## 2011-01-29 DIAGNOSIS — Z5112 Encounter for antineoplastic immunotherapy: Secondary | ICD-10-CM

## 2011-01-29 LAB — CBC WITH DIFFERENTIAL/PLATELET
Basophils Absolute: 0 10*3/uL (ref 0.0–0.1)
Eosinophils Absolute: 0 10*3/uL (ref 0.0–0.5)
HGB: 10.6 g/dL — ABNORMAL LOW (ref 11.6–15.9)
MCV: 84 fL (ref 79.5–101.0)
MONO#: 0.4 10*3/uL (ref 0.1–0.9)
MONO%: 10.9 % (ref 0.0–14.0)
NEUT#: 2.5 10*3/uL (ref 1.5–6.5)
Platelets: 190 10*3/uL (ref 145–400)
RBC: 3.87 10*6/uL (ref 3.70–5.45)
RDW: 16.5 % — ABNORMAL HIGH (ref 11.2–14.5)
WBC: 3.4 10*3/uL — ABNORMAL LOW (ref 3.9–10.3)
nRBC: 0 % (ref 0–0)

## 2011-01-29 LAB — COMPREHENSIVE METABOLIC PANEL
ALT: 30 U/L (ref 0–35)
Albumin: 4.5 g/dL (ref 3.5–5.2)
CO2: 25 mEq/L (ref 19–32)
Calcium: 9.7 mg/dL (ref 8.4–10.5)
Chloride: 104 mEq/L (ref 96–112)
Glucose, Bld: 94 mg/dL (ref 70–99)
Sodium: 139 mEq/L (ref 135–145)
Total Protein: 7.4 g/dL (ref 6.0–8.3)

## 2011-01-29 MED ORDER — IOHEXOL 300 MG/ML  SOLN
11.0000 mL | Freq: Once | INTRAMUSCULAR | Status: AC | PRN
  Administered 2011-01-29: 11 mL via INTRAVENOUS

## 2011-01-30 ENCOUNTER — Encounter (HOSPITAL_BASED_OUTPATIENT_CLINIC_OR_DEPARTMENT_OTHER): Payer: BC Managed Care – PPO | Admitting: Oncology

## 2011-01-30 DIAGNOSIS — Z5189 Encounter for other specified aftercare: Secondary | ICD-10-CM

## 2011-01-30 DIAGNOSIS — C50419 Malignant neoplasm of upper-outer quadrant of unspecified female breast: Secondary | ICD-10-CM

## 2011-01-31 ENCOUNTER — Encounter (HOSPITAL_BASED_OUTPATIENT_CLINIC_OR_DEPARTMENT_OTHER): Payer: BC Managed Care – PPO | Admitting: Oncology

## 2011-01-31 DIAGNOSIS — C50419 Malignant neoplasm of upper-outer quadrant of unspecified female breast: Secondary | ICD-10-CM

## 2011-01-31 DIAGNOSIS — Z5189 Encounter for other specified aftercare: Secondary | ICD-10-CM

## 2011-02-01 ENCOUNTER — Encounter (HOSPITAL_BASED_OUTPATIENT_CLINIC_OR_DEPARTMENT_OTHER): Payer: BC Managed Care – PPO | Admitting: Oncology

## 2011-02-01 DIAGNOSIS — C50419 Malignant neoplasm of upper-outer quadrant of unspecified female breast: Secondary | ICD-10-CM

## 2011-02-01 DIAGNOSIS — Z5189 Encounter for other specified aftercare: Secondary | ICD-10-CM

## 2011-02-05 ENCOUNTER — Other Ambulatory Visit: Payer: Self-pay | Admitting: Oncology

## 2011-02-05 ENCOUNTER — Encounter (HOSPITAL_BASED_OUTPATIENT_CLINIC_OR_DEPARTMENT_OTHER): Payer: BC Managed Care – PPO | Admitting: Oncology

## 2011-02-05 DIAGNOSIS — Z5111 Encounter for antineoplastic chemotherapy: Secondary | ICD-10-CM

## 2011-02-05 DIAGNOSIS — C50419 Malignant neoplasm of upper-outer quadrant of unspecified female breast: Secondary | ICD-10-CM

## 2011-02-05 DIAGNOSIS — D509 Iron deficiency anemia, unspecified: Secondary | ICD-10-CM

## 2011-02-05 DIAGNOSIS — Z5189 Encounter for other specified aftercare: Secondary | ICD-10-CM

## 2011-02-05 DIAGNOSIS — Z5112 Encounter for antineoplastic immunotherapy: Secondary | ICD-10-CM

## 2011-02-05 LAB — CBC WITH DIFFERENTIAL/PLATELET
BASO%: 0.7 % (ref 0.0–2.0)
Eosinophils Absolute: 0.1 10*3/uL (ref 0.0–0.5)
LYMPH%: 13.1 % — ABNORMAL LOW (ref 14.0–49.7)
MCHC: 32.8 g/dL (ref 31.5–36.0)
MONO#: 0.5 10*3/uL (ref 0.1–0.9)
NEUT#: 2.9 10*3/uL (ref 1.5–6.5)
RBC: 3.89 10*6/uL (ref 3.70–5.45)
RDW: 16.4 % — ABNORMAL HIGH (ref 11.2–14.5)
WBC: 4.1 10*3/uL (ref 3.9–10.3)
lymph#: 0.5 10*3/uL — ABNORMAL LOW (ref 0.9–3.3)
nRBC: 0 % (ref 0–0)

## 2011-02-05 LAB — COMPREHENSIVE METABOLIC PANEL
AST: 62 U/L — ABNORMAL HIGH (ref 0–37)
Albumin: 4.8 g/dL (ref 3.5–5.2)
BUN: 16 mg/dL (ref 6–23)
CO2: 25 mEq/L (ref 19–32)
Calcium: 10.2 mg/dL (ref 8.4–10.5)
Chloride: 103 mEq/L (ref 96–112)
Glucose, Bld: 83 mg/dL (ref 70–99)
Potassium: 4.2 mEq/L (ref 3.5–5.3)

## 2011-02-06 ENCOUNTER — Encounter (HOSPITAL_BASED_OUTPATIENT_CLINIC_OR_DEPARTMENT_OTHER): Payer: BC Managed Care – PPO | Admitting: Oncology

## 2011-02-06 DIAGNOSIS — C50419 Malignant neoplasm of upper-outer quadrant of unspecified female breast: Secondary | ICD-10-CM

## 2011-02-06 DIAGNOSIS — Z5189 Encounter for other specified aftercare: Secondary | ICD-10-CM

## 2011-02-07 ENCOUNTER — Encounter (HOSPITAL_BASED_OUTPATIENT_CLINIC_OR_DEPARTMENT_OTHER): Payer: BC Managed Care – PPO | Admitting: Oncology

## 2011-02-07 DIAGNOSIS — Z5189 Encounter for other specified aftercare: Secondary | ICD-10-CM

## 2011-02-07 DIAGNOSIS — C50419 Malignant neoplasm of upper-outer quadrant of unspecified female breast: Secondary | ICD-10-CM

## 2011-02-08 ENCOUNTER — Encounter (HOSPITAL_BASED_OUTPATIENT_CLINIC_OR_DEPARTMENT_OTHER): Payer: BC Managed Care – PPO | Admitting: Oncology

## 2011-02-08 DIAGNOSIS — Z5189 Encounter for other specified aftercare: Secondary | ICD-10-CM

## 2011-02-08 DIAGNOSIS — C50419 Malignant neoplasm of upper-outer quadrant of unspecified female breast: Secondary | ICD-10-CM

## 2011-02-12 ENCOUNTER — Encounter (HOSPITAL_BASED_OUTPATIENT_CLINIC_OR_DEPARTMENT_OTHER): Payer: BC Managed Care – PPO | Admitting: Oncology

## 2011-02-12 ENCOUNTER — Other Ambulatory Visit: Payer: Self-pay | Admitting: Oncology

## 2011-02-12 DIAGNOSIS — Z5112 Encounter for antineoplastic immunotherapy: Secondary | ICD-10-CM

## 2011-02-12 DIAGNOSIS — Z5111 Encounter for antineoplastic chemotherapy: Secondary | ICD-10-CM

## 2011-02-12 DIAGNOSIS — C50419 Malignant neoplasm of upper-outer quadrant of unspecified female breast: Secondary | ICD-10-CM

## 2011-02-12 DIAGNOSIS — Z170 Estrogen receptor positive status [ER+]: Secondary | ICD-10-CM

## 2011-02-12 LAB — CBC WITH DIFFERENTIAL/PLATELET
BASO%: 0.6 % (ref 0.0–2.0)
Basophils Absolute: 0 10*3/uL (ref 0.0–0.1)
EOS%: 0.8 % (ref 0.0–7.0)
HGB: 10.3 g/dL — ABNORMAL LOW (ref 11.6–15.9)
MCH: 26.9 pg (ref 25.1–34.0)
RDW: 17 % — ABNORMAL HIGH (ref 11.2–14.5)
lymph#: 0.6 10*3/uL — ABNORMAL LOW (ref 0.9–3.3)

## 2011-02-12 LAB — COMPREHENSIVE METABOLIC PANEL
ALT: 58 U/L — ABNORMAL HIGH (ref 0–35)
AST: 36 U/L (ref 0–37)
Albumin: 4.7 g/dL (ref 3.5–5.2)
BUN: 12 mg/dL (ref 6–23)
Calcium: 10.1 mg/dL (ref 8.4–10.5)
Chloride: 104 mEq/L (ref 96–112)
Potassium: 3.8 mEq/L (ref 3.5–5.3)
Total Protein: 7.2 g/dL (ref 6.0–8.3)

## 2011-02-13 ENCOUNTER — Encounter (HOSPITAL_BASED_OUTPATIENT_CLINIC_OR_DEPARTMENT_OTHER): Payer: BC Managed Care – PPO | Admitting: Oncology

## 2011-02-13 DIAGNOSIS — C50419 Malignant neoplasm of upper-outer quadrant of unspecified female breast: Secondary | ICD-10-CM

## 2011-02-13 DIAGNOSIS — Z5189 Encounter for other specified aftercare: Secondary | ICD-10-CM

## 2011-02-14 ENCOUNTER — Encounter: Payer: BC Managed Care – PPO | Admitting: Oncology

## 2011-02-14 ENCOUNTER — Ambulatory Visit
Admission: RE | Admit: 2011-02-14 | Discharge: 2011-02-14 | Disposition: A | Discharge status: Home / Self Care | Payer: BC Managed Care – PPO | Source: Ambulatory Visit | Attending: Oncology | Admitting: Oncology

## 2011-02-14 DIAGNOSIS — C50419 Malignant neoplasm of upper-outer quadrant of unspecified female breast: Secondary | ICD-10-CM

## 2011-02-14 DIAGNOSIS — C50912 Malignant neoplasm of unspecified site of left female breast: Secondary | ICD-10-CM

## 2011-02-14 DIAGNOSIS — Z5189 Encounter for other specified aftercare: Secondary | ICD-10-CM

## 2011-02-14 MED ORDER — GADOBENATE DIMEGLUMINE 529 MG/ML IV SOLN
13.0000 mL | Freq: Once | INTRAVENOUS | Status: AC | PRN
  Administered 2011-02-14: 13 mL via INTRAVENOUS

## 2011-02-15 ENCOUNTER — Encounter: Payer: BC Managed Care – PPO | Admitting: Oncology

## 2011-02-15 DIAGNOSIS — C50419 Malignant neoplasm of upper-outer quadrant of unspecified female breast: Secondary | ICD-10-CM

## 2011-02-15 DIAGNOSIS — Z5189 Encounter for other specified aftercare: Secondary | ICD-10-CM

## 2011-02-19 ENCOUNTER — Other Ambulatory Visit: Payer: Self-pay | Admitting: Oncology

## 2011-02-19 ENCOUNTER — Encounter (HOSPITAL_BASED_OUTPATIENT_CLINIC_OR_DEPARTMENT_OTHER): Payer: BC Managed Care – PPO | Admitting: Oncology

## 2011-02-19 DIAGNOSIS — C50419 Malignant neoplasm of upper-outer quadrant of unspecified female breast: Secondary | ICD-10-CM

## 2011-02-19 DIAGNOSIS — Z5112 Encounter for antineoplastic immunotherapy: Secondary | ICD-10-CM

## 2011-02-19 DIAGNOSIS — Z5111 Encounter for antineoplastic chemotherapy: Secondary | ICD-10-CM

## 2011-02-19 LAB — COMPREHENSIVE METABOLIC PANEL
AST: 34 U/L (ref 0–37)
Albumin: 4.5 g/dL (ref 3.5–5.2)
Alkaline Phosphatase: 101 U/L (ref 39–117)
BUN: 12 mg/dL (ref 6–23)
Potassium: 4.1 mEq/L (ref 3.5–5.3)
Sodium: 141 mEq/L (ref 135–145)
Total Bilirubin: 0.7 mg/dL (ref 0.3–1.2)
Total Protein: 7.5 g/dL (ref 6.0–8.3)

## 2011-02-19 LAB — CBC WITH DIFFERENTIAL/PLATELET
BASO%: 0.5 % (ref 0.0–2.0)
EOS%: 0.3 % (ref 0.0–7.0)
MCH: 26.9 pg (ref 25.1–34.0)
MCHC: 32.7 g/dL (ref 31.5–36.0)
RDW: 17.5 % — ABNORMAL HIGH (ref 11.2–14.5)
lymph#: 0.4 10*3/uL — ABNORMAL LOW (ref 0.9–3.3)

## 2011-02-21 ENCOUNTER — Other Ambulatory Visit: Payer: Self-pay | Admitting: Surgery

## 2011-02-21 DIAGNOSIS — C50912 Malignant neoplasm of unspecified site of left female breast: Secondary | ICD-10-CM

## 2011-02-22 ENCOUNTER — Ambulatory Visit: Payer: BC Managed Care – PPO | Admitting: Cardiology

## 2011-02-22 ENCOUNTER — Other Ambulatory Visit (HOSPITAL_COMMUNITY): Payer: Self-pay | Admitting: Surgery

## 2011-02-22 DIAGNOSIS — C50912 Malignant neoplasm of unspecified site of left female breast: Secondary | ICD-10-CM

## 2011-03-12 ENCOUNTER — Ambulatory Visit (HOSPITAL_COMMUNITY)
Admission: RE | Admit: 2011-03-12 | Discharge: 2011-03-12 | Disposition: A | Discharge status: Home / Self Care | Payer: BC Managed Care – PPO | Source: Ambulatory Visit | Attending: Surgery | Admitting: Surgery

## 2011-03-12 ENCOUNTER — Other Ambulatory Visit (INDEPENDENT_AMBULATORY_CARE_PROVIDER_SITE_OTHER): Payer: Self-pay | Admitting: Surgery

## 2011-03-12 ENCOUNTER — Ambulatory Visit
Admission: RE | Admit: 2011-03-12 | Discharge: 2011-03-12 | Disposition: A | Discharge status: Home / Self Care | Payer: BC Managed Care – PPO | Source: Ambulatory Visit | Attending: Surgery | Admitting: Surgery

## 2011-03-12 ENCOUNTER — Ambulatory Visit (HOSPITAL_BASED_OUTPATIENT_CLINIC_OR_DEPARTMENT_OTHER)
Admission: RE | Admit: 2011-03-12 | Discharge: 2011-03-12 | Disposition: A | Discharge status: Home / Self Care | Payer: BC Managed Care – PPO | Source: Ambulatory Visit | Attending: Surgery | Admitting: Surgery

## 2011-03-12 DIAGNOSIS — C50919 Malignant neoplasm of unspecified site of unspecified female breast: Secondary | ICD-10-CM

## 2011-03-12 DIAGNOSIS — C50912 Malignant neoplasm of unspecified site of left female breast: Secondary | ICD-10-CM

## 2011-03-12 DIAGNOSIS — T82598A Other mechanical complication of other cardiac and vascular devices and implants, initial encounter: Secondary | ICD-10-CM | POA: Insufficient documentation

## 2011-03-12 DIAGNOSIS — Z01818 Encounter for other preprocedural examination: Secondary | ICD-10-CM | POA: Insufficient documentation

## 2011-03-12 DIAGNOSIS — Y831 Surgical operation with implant of artificial internal device as the cause of abnormal reaction of the patient, or of later complication, without mention of misadventure at the time of the procedure: Secondary | ICD-10-CM | POA: Insufficient documentation

## 2011-03-12 DIAGNOSIS — Z01812 Encounter for preprocedural laboratory examination: Secondary | ICD-10-CM | POA: Insufficient documentation

## 2011-03-12 MED ORDER — TECHNETIUM TC 99M SULFUR COLLOID FILTERED
1.0000 | Freq: Once | INTRAVENOUS | Status: AC | PRN
  Administered 2011-03-12: 1 via INTRADERMAL

## 2011-03-13 ENCOUNTER — Other Ambulatory Visit (INDEPENDENT_AMBULATORY_CARE_PROVIDER_SITE_OTHER): Payer: Self-pay | Admitting: Surgery

## 2011-03-13 LAB — POCT HEMOGLOBIN-HEMACUE: Hemoglobin: 11 g/dL — ABNORMAL LOW (ref 12.0–15.0)

## 2011-03-13 NOTE — Op Note (Addendum)
[name], [name]                ACCOUNT NO.:  [id number]  MEDICAL RECORD NO.:  [id number]  LOCATION:  XRAY                         FACILITY:  [facility]  PHYSICIAN:  Maisie Fus A. Shaneese Tait, M.D.DATE OF BIRTH:  1961/04/17  DATE OF PROCEDURE:  03/12/2011 DATE OF DISCHARGE:  03/12/2011                              OPERATIVE REPORT   PREOPERATIVE DIAGNOSIS:  Left breast cancer.  POSTOPERATIVE DIAGNOSIS:  Left breast cancer.  PROCEDURES: 1. Left breast needle-localized partial mastectomy. 2. Left axillary sentinel lymph node mapping with injection of     methylene blue dye. 3. Replacement of malfunctioning right subclavian Port-A-Cath with     fluoroscopy. 4. Explantation of Port-A-Cath.  SURGEON:  Maisie Fus A. Mandell Pangborn, MD  ANESTHESIA:  LMA with 0.25% Sensorcaine local.  ESTIMATED BLOOD LOSS:  Minimal.  SPECIMEN: 1. Left breast mass with localizing wire and clips to pathology. 2. Three left axillary sentinel nodes to pathology. 3. Explantation of Port-A-Cath.  DRAINS:  None.  INDICATIONS FOR PROCEDURE:  The patient is a 50 year old female with a large left breast cancer.  She underwent neoadjuvant chemotherapy.  She had a very good response.  She is here today to try to conserve her left breast.  She has a crack in her Port-A-Cath and has to be removed since she will need to have further chemotherapy postoperatively.  A shrunk by significant amount by MRI, she had a very good response.  We really want to conserve her breast.  We talked about the lumpectomy and sentinel node mapping on the left.  She understood that the if this fails, she would require mastectomy, and was willing to proceed with lumpectomy at the front.  Risk of bleeding, infection, seroma formation, left arm stiffness, shoulder stiffness, swelling of the arm, extremity and/or potential issues.  Also with the Port-A-Cath removing, it could cause fragmentation of the Port-A-Cath and then having to replace, it  may require another needle stick of the right subclavian vein.  Risk of bleeding, infection, pneumothorax, hemothorax, injury to mediastinal structures, catheter migration, catheter perforation, superior vena cava, right atrial and right ventricle with resultant pericardial tamponade are all potential risks.  She understood the risk and agreed to proceed, and she need further chemotherapy.  DESCRIPTION OF PROCEDURE:  After undergoing left breast injection of technetium sulfur colloid, the patient was seen in the in the holding area and left breast was marked and the Port-A-Cath site was marked by me.  Questions were answered.  Film was available.  They agreed to proceed.  At this point in time, she was taken back to the operating room.  She was placed supine on the operative table.  LMA anesthesia was initiated. I sterilely prepped the left breast injected, 4 mL of methylene blue dye in a subareolar position of the left breast.  Massage was done.  Left and right breast and chest regions were prepped and draped in sterile fashion.  Time-out was done.  The Port-A-Cath was addressed first. Incision was made over the Port-A-Cath site.  I dissected down until I got to the actual hub of the Port-A-Cath, I was able to grab this and pulled this out after cutting the sutures securing into  the chest wall. I pulled the catheter out and then cut off the hub and the left catheter in place.  I then entered the fetal wire down the catheter, but could not due to clot.  I pulled the catheter a little bit more and found the fracture which was just in the clavicle of the catheter and again tried to see if I could fed the wire instead to exchange it, but could not and I elected to go ahead and was pulled the entire catheter out and start a new.  Once I did this, entire catheter was intact and the fragments were passed off the field.  I then with the patient in Trendelenburg accessed the right subclavian  vein without difficulty.  The wire was fed through this.  I got a nice return of dark nonpulsatile blood.  Fluoroscopy showed the wire to go against through superior vena cava.  I then brought a new 8-French Bard power Port-A-Cath onto the field, cut to about 17 cm.  With the patient with her head down, I was able to advance the introducer dilator complex over the wire moving the wire to-and-fro without resistance.  I then took out the dilator and wire leaving introducer in place.  Catheter was fed through this without difficulty and the peel-away sheath was pealed away.  Fluoroscopy showed the tip to be in the superior vena cava above mid superior vena cava looked like. No obvious hematoma or pneumothorax.  Catheter drew back easily and flushed easily.  It was flushed with 5 mL of 100 units/mL of heparinized saline.  Secured the chest wall with single stitch of 2-0 Prolene.  I then closed this wound with a combination of 3-0 Vicryl and 4-0 Monocryl.  Dermabond was applied.  We then addressed the left breast.  NeoProbe was used,  hotspot was then identified in the left axilla and a small incision was made in the leftaxilla with local anesthesia being infiltrated into the skin. Dissection was carried down.  I identified three hot blue sentinel nodes and removed these from the level I node basin.  These were sent to pathology.  Irrigation was used, which was found to be hemostatic. Next, the lumpectomy was done.  Curvilinear incision was made in the left lateral outer quadrant of the breast where the wire entered the breast.  This area was relatively scarred and had some fibrocystic changes, but I took a wide lumpectomy of this area keeping the wire and clip at the middle of all this.  It is a fairly generous lumpectomy. Again, she had a lot of fibrocystic changes, hard to tell any residual tumor or scar.  At any event, we did almost a complete quadrantectomy in that area.  I then irrigated  the cavity.  Specimens were oriented appropriately.  Margins were painted and sent to pathology.  Radiograph revealed the entire specimen to be adequate with the wire and clips in it.  After irrigating the cavity, a Surgicel was placed for hemostasis. I closed the cavity with deep layer of 3-0 Vicryl and subsequent 3-0 Monocryl stitch in a subcuticular fashion.  The axilla was examined and found to be hemostatic.  Surgicel was placed.  I closed the deep layer with 3-0 Vicryl and 3-0 Monocryl.  Dermabond was applied to both incisions.  All final counts, sponge, needle and instruments were found to be correct at this portion of the case.  The patient was awoke, extubated, and taken to recovery in satisfactory condition.  Chest x-ray will be obtained in the recovery room.     Vonn Sliger A. Adan Beal, M.D.   ______________________________ Clovis Pu. Yarisbel Miranda, M.D.    TAC/MEDQ  D:  03/12/2011  T:  03/13/2011  Job:  [id number]  cc:   Drue Second, M.D.  Electronically Signed by Harriette Bouillon M.D. on 04/28/2011 05:14:06 PM

## 2011-04-02 ENCOUNTER — Other Ambulatory Visit: Payer: Self-pay | Admitting: Physician Assistant

## 2011-04-02 ENCOUNTER — Encounter (HOSPITAL_BASED_OUTPATIENT_CLINIC_OR_DEPARTMENT_OTHER): Payer: BC Managed Care – PPO | Admitting: Oncology

## 2011-04-02 DIAGNOSIS — Z5111 Encounter for antineoplastic chemotherapy: Secondary | ICD-10-CM

## 2011-04-02 DIAGNOSIS — C50419 Malignant neoplasm of upper-outer quadrant of unspecified female breast: Secondary | ICD-10-CM

## 2011-04-02 DIAGNOSIS — Z5112 Encounter for antineoplastic immunotherapy: Secondary | ICD-10-CM

## 2011-04-02 LAB — CBC WITH DIFFERENTIAL/PLATELET
BASO%: 0.2 % (ref 0.0–2.0)
EOS%: 0.8 % (ref 0.0–7.0)
MCH: 26.8 pg (ref 25.1–34.0)
MCHC: 33.2 g/dL (ref 31.5–36.0)
MCV: 80.5 fL (ref 79.5–101.0)
MONO%: 5.7 % (ref 0.0–14.0)
RBC: 4.37 10*6/uL (ref 3.70–5.45)
RDW: 16.6 % — ABNORMAL HIGH (ref 11.2–14.5)

## 2011-04-03 LAB — COMPREHENSIVE METABOLIC PANEL
ALT: 14 U/L (ref 0–35)
AST: 18 U/L (ref 0–37)
Albumin: 4.2 g/dL (ref 3.5–5.2)
Alkaline Phosphatase: 81 U/L (ref 39–117)
BUN: 18 mg/dL (ref 6–23)
Potassium: 4 mEq/L (ref 3.5–5.3)
Sodium: 140 mEq/L (ref 135–145)
Total Protein: 7.3 g/dL (ref 6.0–8.3)

## 2011-04-04 ENCOUNTER — Ambulatory Visit
Admission: RE | Admit: 2011-04-04 | Discharge: 2011-04-04 | Disposition: A | Discharge status: Home / Self Care | Payer: BC Managed Care – PPO | Source: Ambulatory Visit | Attending: Radiation Oncology | Admitting: Radiation Oncology

## 2011-04-04 DIAGNOSIS — C50419 Malignant neoplasm of upper-outer quadrant of unspecified female breast: Secondary | ICD-10-CM | POA: Insufficient documentation

## 2011-04-04 DIAGNOSIS — D509 Iron deficiency anemia, unspecified: Secondary | ICD-10-CM | POA: Insufficient documentation

## 2011-04-04 DIAGNOSIS — Z510 Encounter for antineoplastic radiation therapy: Secondary | ICD-10-CM | POA: Insufficient documentation

## 2011-04-06 ENCOUNTER — Ambulatory Visit (HOSPITAL_COMMUNITY)
Admission: RE | Admit: 2011-04-06 | Discharge: 2011-04-06 | Disposition: A | Discharge status: Home / Self Care | Payer: BC Managed Care – PPO | Source: Ambulatory Visit | Attending: Oncology | Admitting: Oncology

## 2011-04-06 DIAGNOSIS — Z09 Encounter for follow-up examination after completed treatment for conditions other than malignant neoplasm: Secondary | ICD-10-CM | POA: Insufficient documentation

## 2011-04-06 DIAGNOSIS — C50919 Malignant neoplasm of unspecified site of unspecified female breast: Secondary | ICD-10-CM | POA: Insufficient documentation

## 2011-04-06 DIAGNOSIS — I080 Rheumatic disorders of both mitral and aortic valves: Secondary | ICD-10-CM | POA: Insufficient documentation

## 2011-04-09 ENCOUNTER — Other Ambulatory Visit: Payer: Self-pay | Admitting: Oncology

## 2011-04-09 ENCOUNTER — Encounter (HOSPITAL_BASED_OUTPATIENT_CLINIC_OR_DEPARTMENT_OTHER): Payer: BC Managed Care – PPO | Admitting: Oncology

## 2011-04-09 DIAGNOSIS — C50419 Malignant neoplasm of upper-outer quadrant of unspecified female breast: Secondary | ICD-10-CM

## 2011-04-09 DIAGNOSIS — Z5111 Encounter for antineoplastic chemotherapy: Secondary | ICD-10-CM

## 2011-04-09 DIAGNOSIS — Z5112 Encounter for antineoplastic immunotherapy: Secondary | ICD-10-CM

## 2011-04-09 LAB — CBC WITH DIFFERENTIAL/PLATELET
Basophils Absolute: 0 10*3/uL (ref 0.0–0.1)
Eosinophils Absolute: 0 10*3/uL (ref 0.0–0.5)
LYMPH%: 21.7 % (ref 14.0–49.7)
MCV: 79.4 fL — ABNORMAL LOW (ref 79.5–101.0)
MONO%: 8.1 % (ref 0.0–14.0)
NEUT#: 2.3 10*3/uL (ref 1.5–6.5)
Platelets: 128 10*3/uL — ABNORMAL LOW (ref 145–400)
RBC: 4.47 10*6/uL (ref 3.70–5.45)
nRBC: 0 % (ref 0–0)

## 2011-04-09 LAB — BASIC METABOLIC PANEL
CO2: 28 mEq/L (ref 19–32)
Calcium: 10 mg/dL (ref 8.4–10.5)
Creatinine, Ser: 0.65 mg/dL (ref 0.50–1.10)
Glucose, Bld: 99 mg/dL (ref 70–99)

## 2011-04-11 ENCOUNTER — Other Ambulatory Visit: Payer: Self-pay | Admitting: Oncology

## 2011-04-11 ENCOUNTER — Encounter (HOSPITAL_BASED_OUTPATIENT_CLINIC_OR_DEPARTMENT_OTHER): Payer: BC Managed Care – PPO | Admitting: Oncology

## 2011-04-11 ENCOUNTER — Other Ambulatory Visit: Payer: Self-pay | Admitting: Physician Assistant

## 2011-04-11 DIAGNOSIS — R110 Nausea: Secondary | ICD-10-CM

## 2011-04-11 DIAGNOSIS — Z5111 Encounter for antineoplastic chemotherapy: Secondary | ICD-10-CM

## 2011-04-11 DIAGNOSIS — C50419 Malignant neoplasm of upper-outer quadrant of unspecified female breast: Secondary | ICD-10-CM

## 2011-04-11 LAB — CBC WITH DIFFERENTIAL/PLATELET
BASO%: 0 % (ref 0.0–2.0)
EOS%: 0 % (ref 0.0–7.0)
MCH: 27.9 pg (ref 25.1–34.0)
MCHC: 34 g/dL (ref 31.5–36.0)
RBC: 4.37 10*6/uL (ref 3.70–5.45)
RDW: 18.5 % — ABNORMAL HIGH (ref 11.2–14.5)
lymph#: 0.2 10*3/uL — ABNORMAL LOW (ref 0.9–3.3)

## 2011-04-11 LAB — URINALYSIS, MICROSCOPIC - CHCC
Bilirubin (Urine): NEGATIVE
Ketones: NEGATIVE mg/dL

## 2011-04-11 LAB — COMPREHENSIVE METABOLIC PANEL
ALT: 18 U/L (ref 0–35)
AST: 22 U/L (ref 0–37)
BUN: 14 mg/dL (ref 6–23)
CO2: 26 mEq/L (ref 19–32)
Creatinine, Ser: 0.67 mg/dL (ref 0.50–1.10)
Total Bilirubin: 1.3 mg/dL — ABNORMAL HIGH (ref 0.3–1.2)

## 2011-04-16 ENCOUNTER — Other Ambulatory Visit: Payer: Self-pay | Admitting: Oncology

## 2011-04-16 ENCOUNTER — Encounter (HOSPITAL_BASED_OUTPATIENT_CLINIC_OR_DEPARTMENT_OTHER): Payer: BC Managed Care – PPO | Admitting: Oncology

## 2011-04-16 DIAGNOSIS — Z5111 Encounter for antineoplastic chemotherapy: Secondary | ICD-10-CM

## 2011-04-16 DIAGNOSIS — Z5112 Encounter for antineoplastic immunotherapy: Secondary | ICD-10-CM

## 2011-04-16 DIAGNOSIS — C50419 Malignant neoplasm of upper-outer quadrant of unspecified female breast: Secondary | ICD-10-CM

## 2011-04-16 LAB — BASIC METABOLIC PANEL
BUN: 15 mg/dL (ref 6–23)
CO2: 27 mEq/L (ref 19–32)
Chloride: 105 mEq/L (ref 96–112)
Creatinine, Ser: 0.64 mg/dL (ref 0.50–1.10)
Glucose, Bld: 103 mg/dL — ABNORMAL HIGH (ref 70–99)
Potassium: 3.8 mEq/L (ref 3.5–5.3)

## 2011-04-16 LAB — CBC WITH DIFFERENTIAL/PLATELET
BASO%: 0.6 % (ref 0.0–2.0)
Basophils Absolute: 0 10*3/uL (ref 0.0–0.1)
HCT: 35.1 % (ref 34.8–46.6)
HGB: 11.9 g/dL (ref 11.6–15.9)
LYMPH%: 23.3 % (ref 14.0–49.7)
MCHC: 33.9 g/dL (ref 31.5–36.0)
MONO#: 0.3 10*3/uL (ref 0.1–0.9)
NEUT%: 65.8 % (ref 38.4–76.8)
Platelets: 126 10*3/uL — ABNORMAL LOW (ref 145–400)
WBC: 3.2 10*3/uL — ABNORMAL LOW (ref 3.9–10.3)

## 2011-04-18 ENCOUNTER — Other Ambulatory Visit (HOSPITAL_COMMUNITY): Payer: BC Managed Care – PPO

## 2011-04-18 ENCOUNTER — Ambulatory Visit (HOSPITAL_COMMUNITY): Payer: BC Managed Care – PPO

## 2011-04-20 ENCOUNTER — Other Ambulatory Visit (HOSPITAL_COMMUNITY): Payer: BC Managed Care – PPO

## 2011-04-23 ENCOUNTER — Other Ambulatory Visit: Payer: Self-pay | Admitting: Oncology

## 2011-04-23 ENCOUNTER — Encounter (INDEPENDENT_AMBULATORY_CARE_PROVIDER_SITE_OTHER): Payer: BC Managed Care – PPO | Admitting: Surgery

## 2011-04-23 ENCOUNTER — Encounter (HOSPITAL_BASED_OUTPATIENT_CLINIC_OR_DEPARTMENT_OTHER): Payer: BC Managed Care – PPO | Admitting: Oncology

## 2011-04-23 DIAGNOSIS — C50419 Malignant neoplasm of upper-outer quadrant of unspecified female breast: Secondary | ICD-10-CM

## 2011-04-23 DIAGNOSIS — Z5112 Encounter for antineoplastic immunotherapy: Secondary | ICD-10-CM

## 2011-04-23 DIAGNOSIS — Z5111 Encounter for antineoplastic chemotherapy: Secondary | ICD-10-CM

## 2011-04-23 LAB — CBC WITH DIFFERENTIAL/PLATELET
BASO%: 0.5 % (ref 0.0–2.0)
Basophils Absolute: 0 10*3/uL (ref 0.0–0.1)
EOS%: 0.5 % (ref 0.0–7.0)
HCT: 36.2 % (ref 34.8–46.6)
HGB: 12.4 g/dL (ref 11.6–15.9)
LYMPH%: 14.4 % (ref 14.0–49.7)
MCH: 27.4 pg (ref 25.1–34.0)
MCHC: 34.3 g/dL (ref 31.5–36.0)
MCV: 79.9 fL (ref 79.5–101.0)
NEUT%: 75.7 % (ref 38.4–76.8)
Platelets: 137 10*3/uL — ABNORMAL LOW (ref 145–400)

## 2011-04-23 LAB — BASIC METABOLIC PANEL
BUN: 15 mg/dL (ref 6–23)
Creatinine, Ser: 0.71 mg/dL (ref 0.50–1.10)
Glucose, Bld: 91 mg/dL (ref 70–99)

## 2011-04-30 ENCOUNTER — Encounter (HOSPITAL_BASED_OUTPATIENT_CLINIC_OR_DEPARTMENT_OTHER): Payer: BC Managed Care – PPO | Admitting: Oncology

## 2011-04-30 ENCOUNTER — Other Ambulatory Visit: Payer: Self-pay | Admitting: Oncology

## 2011-04-30 DIAGNOSIS — Z5111 Encounter for antineoplastic chemotherapy: Secondary | ICD-10-CM

## 2011-04-30 DIAGNOSIS — Z5112 Encounter for antineoplastic immunotherapy: Secondary | ICD-10-CM

## 2011-04-30 DIAGNOSIS — C50419 Malignant neoplasm of upper-outer quadrant of unspecified female breast: Secondary | ICD-10-CM

## 2011-04-30 LAB — CBC WITH DIFFERENTIAL/PLATELET
BASO%: 0.3 % (ref 0.0–2.0)
Eosinophils Absolute: 0 10*3/uL (ref 0.0–0.5)
LYMPH%: 16.7 % (ref 14.0–49.7)
MCHC: 34.4 g/dL (ref 31.5–36.0)
MCV: 80.6 fL (ref 79.5–101.0)
MONO#: 0.3 10*3/uL (ref 0.1–0.9)
MONO%: 10.4 % (ref 0.0–14.0)
NEUT#: 2.1 10*3/uL (ref 1.5–6.5)
Platelets: 128 10*3/uL — ABNORMAL LOW (ref 145–400)
RBC: 4.22 10*6/uL (ref 3.70–5.45)
RDW: 16.2 % — ABNORMAL HIGH (ref 11.2–14.5)
WBC: 2.9 10*3/uL — ABNORMAL LOW (ref 3.9–10.3)
nRBC: 0 % (ref 0–0)

## 2011-04-30 LAB — BASIC METABOLIC PANEL
CO2: 27 mEq/L (ref 19–32)
Calcium: 9.9 mg/dL (ref 8.4–10.5)
Creatinine, Ser: 0.67 mg/dL (ref 0.50–1.10)
Sodium: 140 mEq/L (ref 135–145)

## 2011-05-07 ENCOUNTER — Encounter (HOSPITAL_BASED_OUTPATIENT_CLINIC_OR_DEPARTMENT_OTHER): Payer: BC Managed Care – PPO | Admitting: Oncology

## 2011-05-07 ENCOUNTER — Other Ambulatory Visit: Payer: Self-pay | Admitting: Oncology

## 2011-05-07 DIAGNOSIS — C50419 Malignant neoplasm of upper-outer quadrant of unspecified female breast: Secondary | ICD-10-CM

## 2011-05-07 DIAGNOSIS — Z5112 Encounter for antineoplastic immunotherapy: Secondary | ICD-10-CM

## 2011-05-07 DIAGNOSIS — D72819 Decreased white blood cell count, unspecified: Secondary | ICD-10-CM

## 2011-05-07 DIAGNOSIS — D696 Thrombocytopenia, unspecified: Secondary | ICD-10-CM

## 2011-05-07 LAB — CBC WITH DIFFERENTIAL/PLATELET
Basophils Absolute: 0 10*3/uL (ref 0.0–0.1)
Eosinophils Absolute: 0 10*3/uL (ref 0.0–0.5)
HCT: 34.4 % — ABNORMAL LOW (ref 34.8–46.6)
HGB: 11.7 g/dL (ref 11.6–15.9)
MONO#: 0.3 10*3/uL (ref 0.1–0.9)
NEUT#: 1.8 10*3/uL (ref 1.5–6.5)
NEUT%: 69.4 % (ref 38.4–76.8)
RDW: 15.6 % — ABNORMAL HIGH (ref 11.2–14.5)
lymph#: 0.5 10*3/uL — ABNORMAL LOW (ref 0.9–3.3)

## 2011-05-07 LAB — BASIC METABOLIC PANEL
BUN: 11 mg/dL (ref 6–23)
CO2: 26 mEq/L (ref 19–32)
Chloride: 104 mEq/L (ref 96–112)
Glucose, Bld: 93 mg/dL (ref 70–99)
Potassium: 3.6 mEq/L (ref 3.5–5.3)
Sodium: 139 mEq/L (ref 135–145)

## 2011-05-14 ENCOUNTER — Other Ambulatory Visit: Payer: Self-pay | Admitting: Oncology

## 2011-05-14 ENCOUNTER — Encounter (HOSPITAL_BASED_OUTPATIENT_CLINIC_OR_DEPARTMENT_OTHER): Payer: BC Managed Care – PPO | Admitting: Oncology

## 2011-05-14 DIAGNOSIS — Z5111 Encounter for antineoplastic chemotherapy: Secondary | ICD-10-CM

## 2011-05-14 DIAGNOSIS — Z5112 Encounter for antineoplastic immunotherapy: Secondary | ICD-10-CM

## 2011-05-14 DIAGNOSIS — C50419 Malignant neoplasm of upper-outer quadrant of unspecified female breast: Secondary | ICD-10-CM

## 2011-05-14 LAB — CBC WITH DIFFERENTIAL/PLATELET
BASO%: 0.8 % (ref 0.0–2.0)
HCT: 37 % (ref 34.8–46.6)
LYMPH%: 16.3 % (ref 14.0–49.7)
MCHC: 34.1 g/dL (ref 31.5–36.0)
MCV: 81.3 fL (ref 79.5–101.0)
MONO#: 0.4 10*3/uL (ref 0.1–0.9)
MONO%: 17.1 % — ABNORMAL HIGH (ref 0.0–14.0)
NEUT%: 64.6 % (ref 38.4–76.8)
Platelets: 93 10*3/uL — ABNORMAL LOW (ref 145–400)
WBC: 2.5 10*3/uL — ABNORMAL LOW (ref 3.9–10.3)

## 2011-05-14 LAB — BASIC METABOLIC PANEL
CO2: 27 mEq/L (ref 19–32)
Calcium: 9.6 mg/dL (ref 8.4–10.5)
Creatinine, Ser: 0.66 mg/dL (ref 0.50–1.10)
Glucose, Bld: 97 mg/dL (ref 70–99)

## 2011-05-21 ENCOUNTER — Other Ambulatory Visit: Payer: Self-pay | Admitting: Oncology

## 2011-05-21 ENCOUNTER — Encounter (HOSPITAL_BASED_OUTPATIENT_CLINIC_OR_DEPARTMENT_OTHER): Payer: BC Managed Care – PPO

## 2011-05-21 DIAGNOSIS — C50419 Malignant neoplasm of upper-outer quadrant of unspecified female breast: Secondary | ICD-10-CM

## 2011-05-21 DIAGNOSIS — Z170 Estrogen receptor positive status [ER+]: Secondary | ICD-10-CM

## 2011-05-21 DIAGNOSIS — Z5111 Encounter for antineoplastic chemotherapy: Secondary | ICD-10-CM

## 2011-05-21 LAB — CBC WITH DIFFERENTIAL (CANCER CENTER ONLY)
BASO%: 0 % (ref 0.0–2.0)
EOS%: 0.9 % (ref 0.0–7.0)
HGB: 12.2 g/dL (ref 11.6–15.9)
LYMPH#: 0.3 10*3/uL — ABNORMAL LOW (ref 0.9–3.3)
MCHC: 35.3 g/dL (ref 32.0–36.0)
NEUT#: 1.3 10*3/uL — ABNORMAL LOW (ref 1.5–6.5)
RDW: 14.8 % (ref 11.1–15.7)

## 2011-05-21 LAB — COMPREHENSIVE METABOLIC PANEL
ALT: 20 U/L (ref 0–35)
AST: 27 U/L (ref 0–37)
Albumin: 4.2 g/dL (ref 3.5–5.2)
Calcium: 9.7 mg/dL (ref 8.4–10.5)
Chloride: 103 mEq/L (ref 96–112)
Potassium: 3.7 mEq/L (ref 3.5–5.3)
Total Protein: 7.4 g/dL (ref 6.0–8.3)

## 2011-06-04 ENCOUNTER — Other Ambulatory Visit: Payer: Self-pay | Admitting: Oncology

## 2011-06-04 ENCOUNTER — Encounter (HOSPITAL_BASED_OUTPATIENT_CLINIC_OR_DEPARTMENT_OTHER): Payer: BC Managed Care – PPO | Admitting: Oncology

## 2011-06-04 DIAGNOSIS — Z5111 Encounter for antineoplastic chemotherapy: Secondary | ICD-10-CM

## 2011-06-04 DIAGNOSIS — Z170 Estrogen receptor positive status [ER+]: Secondary | ICD-10-CM

## 2011-06-04 DIAGNOSIS — C50419 Malignant neoplasm of upper-outer quadrant of unspecified female breast: Secondary | ICD-10-CM

## 2011-06-04 DIAGNOSIS — R110 Nausea: Secondary | ICD-10-CM

## 2011-06-04 LAB — BASIC METABOLIC PANEL
Chloride: 103 mEq/L (ref 96–112)
Creatinine, Ser: 0.63 mg/dL (ref 0.50–1.10)

## 2011-06-04 LAB — CBC WITH DIFFERENTIAL/PLATELET
BASO%: 0.3 % (ref 0.0–2.0)
EOS%: 0.6 % (ref 0.0–7.0)
HCT: 35.8 % (ref 34.8–46.6)
MCH: 28.1 pg (ref 25.1–34.0)
MCHC: 34.1 g/dL (ref 31.5–36.0)
NEUT%: 75 % (ref 38.4–76.8)
RDW: 14.3 % (ref 11.2–14.5)
lymph#: 0.3 10*3/uL — ABNORMAL LOW (ref 0.9–3.3)

## 2011-07-02 ENCOUNTER — Encounter (HOSPITAL_BASED_OUTPATIENT_CLINIC_OR_DEPARTMENT_OTHER): Payer: BC Managed Care – PPO | Admitting: Oncology

## 2011-07-02 ENCOUNTER — Other Ambulatory Visit: Payer: Self-pay | Admitting: Oncology

## 2011-07-02 DIAGNOSIS — R110 Nausea: Secondary | ICD-10-CM

## 2011-07-02 DIAGNOSIS — C50419 Malignant neoplasm of upper-outer quadrant of unspecified female breast: Secondary | ICD-10-CM

## 2011-07-02 DIAGNOSIS — Z5111 Encounter for antineoplastic chemotherapy: Secondary | ICD-10-CM

## 2011-07-02 DIAGNOSIS — Z170 Estrogen receptor positive status [ER+]: Secondary | ICD-10-CM

## 2011-07-02 LAB — COMPREHENSIVE METABOLIC PANEL
ALT: 17 U/L (ref 0–35)
Albumin: 4.2 g/dL (ref 3.5–5.2)
CO2: 26 mEq/L (ref 19–32)
Calcium: 9.7 mg/dL (ref 8.4–10.5)
Chloride: 104 mEq/L (ref 96–112)
Potassium: 3.8 mEq/L (ref 3.5–5.3)
Sodium: 141 mEq/L (ref 135–145)
Total Protein: 6.9 g/dL (ref 6.0–8.3)

## 2011-07-02 LAB — CBC WITH DIFFERENTIAL/PLATELET
Eosinophils Absolute: 0.1 10*3/uL (ref 0.0–0.5)
MONO#: 0.3 10*3/uL (ref 0.1–0.9)
NEUT#: 2.6 10*3/uL (ref 1.5–6.5)
RBC: 4.43 10*6/uL (ref 3.70–5.45)
RDW: 14.7 % — ABNORMAL HIGH (ref 11.2–14.5)
WBC: 3.6 10*3/uL — ABNORMAL LOW (ref 3.9–10.3)
nRBC: 0 % (ref 0–0)

## 2011-07-23 ENCOUNTER — Encounter (HOSPITAL_BASED_OUTPATIENT_CLINIC_OR_DEPARTMENT_OTHER): Payer: BC Managed Care – PPO | Admitting: Oncology

## 2011-07-23 ENCOUNTER — Ambulatory Visit
Admission: RE | Admit: 2011-07-23 | Discharge: 2011-07-23 | Disposition: A | Discharge status: Home / Self Care | Payer: BC Managed Care – PPO | Source: Ambulatory Visit | Attending: Radiation Oncology | Admitting: Radiation Oncology

## 2011-07-23 ENCOUNTER — Other Ambulatory Visit: Payer: Self-pay | Admitting: Oncology

## 2011-07-23 DIAGNOSIS — Z170 Estrogen receptor positive status [ER+]: Secondary | ICD-10-CM

## 2011-07-23 DIAGNOSIS — C50419 Malignant neoplasm of upper-outer quadrant of unspecified female breast: Secondary | ICD-10-CM

## 2011-07-23 DIAGNOSIS — Z5111 Encounter for antineoplastic chemotherapy: Secondary | ICD-10-CM

## 2011-07-23 LAB — BASIC METABOLIC PANEL
BUN: 14 mg/dL (ref 6–23)
Calcium: 9.3 mg/dL (ref 8.4–10.5)
Glucose, Bld: 90 mg/dL (ref 70–99)

## 2011-07-23 LAB — CBC WITH DIFFERENTIAL/PLATELET
BASO%: 1.3 % (ref 0.0–2.0)
LYMPH%: 21.6 % (ref 14.0–49.7)
MCHC: 34.6 g/dL (ref 31.5–36.0)
MONO#: 0.3 10*3/uL (ref 0.1–0.9)
Platelets: 101 10*3/uL — ABNORMAL LOW (ref 145–400)
RBC: 4.34 10*6/uL (ref 3.70–5.45)
WBC: 3.2 10*3/uL — ABNORMAL LOW (ref 3.9–10.3)
lymph#: 0.7 10*3/uL — ABNORMAL LOW (ref 0.9–3.3)
nRBC: 0 % (ref 0–0)

## 2011-08-01 ENCOUNTER — Encounter: Payer: Self-pay | Admitting: *Deleted

## 2011-08-09 ENCOUNTER — Encounter (HOSPITAL_COMMUNITY): Payer: Self-pay | Admitting: Internal Medicine

## 2011-08-09 ENCOUNTER — Encounter: Payer: Self-pay | Admitting: *Deleted

## 2011-08-13 ENCOUNTER — Other Ambulatory Visit: Payer: Self-pay | Admitting: Physician Assistant

## 2011-08-13 ENCOUNTER — Other Ambulatory Visit: Payer: Self-pay | Admitting: Oncology

## 2011-08-13 ENCOUNTER — Encounter (HOSPITAL_BASED_OUTPATIENT_CLINIC_OR_DEPARTMENT_OTHER): Payer: BC Managed Care – PPO | Admitting: Oncology

## 2011-08-13 DIAGNOSIS — Z5112 Encounter for antineoplastic immunotherapy: Secondary | ICD-10-CM

## 2011-08-13 DIAGNOSIS — Z5111 Encounter for antineoplastic chemotherapy: Secondary | ICD-10-CM

## 2011-08-13 DIAGNOSIS — R110 Nausea: Secondary | ICD-10-CM

## 2011-08-13 DIAGNOSIS — Z170 Estrogen receptor positive status [ER+]: Secondary | ICD-10-CM

## 2011-08-13 DIAGNOSIS — C50419 Malignant neoplasm of upper-outer quadrant of unspecified female breast: Secondary | ICD-10-CM

## 2011-08-13 LAB — CBC WITH DIFFERENTIAL/PLATELET
BASO%: 0.4 % (ref 0.0–2.0)
Eosinophils Absolute: 0 10*3/uL (ref 0.0–0.5)
MCHC: 34.6 g/dL (ref 31.5–36.0)
MONO#: 0.2 10*3/uL (ref 0.1–0.9)
NEUT#: 1.5 10*3/uL (ref 1.5–6.5)
RBC: 4.27 10*6/uL (ref 3.70–5.45)
RDW: 14 % (ref 11.2–14.5)
WBC: 2.3 10*3/uL — ABNORMAL LOW (ref 3.9–10.3)
lymph#: 0.6 10*3/uL — ABNORMAL LOW (ref 0.9–3.3)
nRBC: 0 % (ref 0–0)

## 2011-08-13 LAB — COMPREHENSIVE METABOLIC PANEL
Alkaline Phosphatase: 68 U/L (ref 39–117)
CO2: 26 mEq/L (ref 19–32)
Creatinine, Ser: 0.6 mg/dL (ref 0.50–1.10)
Glucose, Bld: 88 mg/dL (ref 70–99)
Sodium: 141 mEq/L (ref 135–145)
Total Bilirubin: 0.6 mg/dL (ref 0.3–1.2)
Total Protein: 7.1 g/dL (ref 6.0–8.3)

## 2011-08-13 LAB — LUTEINIZING HORMONE: LH: 22.3 m[IU]/mL

## 2011-08-13 LAB — FOLLICLE STIMULATING HORMONE: FSH: 60.4 m[IU]/mL

## 2011-08-21 LAB — ESTRADIOL, ULTRA SENS: Estradiol, Ultra Sensitive: 18 pg/mL

## 2011-08-27 ENCOUNTER — Ambulatory Visit (HOSPITAL_COMMUNITY)
Admission: RE | Admit: 2011-08-27 | Discharge: 2011-08-27 | Disposition: A | Discharge status: Home / Self Care | Payer: BC Managed Care – PPO | Source: Ambulatory Visit | Attending: Internal Medicine | Admitting: Internal Medicine

## 2011-08-27 ENCOUNTER — Encounter (HOSPITAL_COMMUNITY): Payer: Self-pay

## 2011-08-27 ENCOUNTER — Ambulatory Visit (HOSPITAL_COMMUNITY)
Admission: RE | Admit: 2011-08-27 | Discharge: 2011-08-27 | Disposition: A | Discharge status: Home / Self Care | Payer: BC Managed Care – PPO | Source: Ambulatory Visit | Attending: Oncology | Admitting: Oncology

## 2011-08-27 VITALS — BP 104/62 | HR 76 | Wt 142.0 lb

## 2011-08-27 DIAGNOSIS — C50919 Malignant neoplasm of unspecified site of unspecified female breast: Secondary | ICD-10-CM | POA: Insufficient documentation

## 2011-08-27 DIAGNOSIS — C50412 Malignant neoplasm of upper-outer quadrant of left female breast: Secondary | ICD-10-CM | POA: Insufficient documentation

## 2011-08-27 DIAGNOSIS — Z09 Encounter for follow-up examination after completed treatment for conditions other than malignant neoplasm: Secondary | ICD-10-CM | POA: Insufficient documentation

## 2011-08-27 NOTE — Assessment & Plan Note (Addendum)
Explained the role of Heart Failure Clinic while on Herceptin.  Discussed risk of cardiotoxicity with past use of Adriamycin and now Herceptin.  Reviewed all echos with the patient.  EF and lateral S' velocity mildly decreased from initial echo in December 2011 but has remained stable since June 2012.  Long discussion regarding treatment options including trial of low dose coreg given mild changes in echo vs close monitoring without treatment at this time.  She would prefer to have close monitoring at this time, therefore will check 2D echo in 2 months rather than 3 months, she is agreeable to this plan.    Patient seen and examined with Ulyess Blossom PA-C. We discussed all aspects of the encounter. I agree with the assessment and plan as stated above. Doubt significant cardio-toxicity at this point but will continue to follow closely  in the cardio-oncology program.

## 2011-08-27 NOTE — Patient Instructions (Addendum)
Continue Herceptin/tamoxifen therapy.  Your physician has requested that you have an echocardiogram. Echocardiography is a painless test that uses sound waves to create images of your heart. It provides your doctor with information about the size and shape of your heart and how well your heart's chambers and valves are working. This procedure takes approximately one hour. There are no restrictions for this procedure.  Follow in 2 months with echocardiogram.

## 2011-08-27 NOTE — Progress Notes (Signed)
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  She received neoadjuvant chemotherapy with 4 cycles of Adriamycin/Cytoxan, followed by Tal/Herceptin then switched to Abraxane/Herceptin.  Status post left breast needle localized partial mastectomy with sentinel node dissection revealing pathologic staging T0N0.  She completed radiation and is now on q3 week maintenance Herceptin until March 2013 and tamoxifen 20 mg daily for five years.  She was referred to Dr. Gala Romney to follow echocardiograms.  Echos: 1. December 2011: EF 60-65% with lateral S' velocity of not available to calculate  2. June 2012: EF 55-60% with lateral S' velocity of 9.4 3. November 2012: EF appox 55% with lateral S' velocity of 9.5 (full report to follow)  She presents today without complaints.  She has bone aches from the tamoxifen but is tolerating the herceptin therapy.  She denies SOB/orthopnea/PND.  No lower extremity edema.  No CP/fever/chills.     ROS: All other systems normal except as mentioned in HPI, past medical history and problem list.    Past Medical History  Diagnosis Date  . Breast cancer Oct 2011  . Peripheral neuropathy     Current Outpatient Prescriptions  Medication Sig Dispense Refill  . Multiple Vitamin (MULTIVITAMIN) capsule Take 1 capsule by mouth daily.        . tamoxifen (NOLVADEX) 20 MG tablet Take 20 mg by mouth daily.           No Known Allergies  History   Social History  . Marital Status: Single    Spouse Name: N/A    Number of Children: N/A  . 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 Smoking status: Never Smoker   . Smokeless tobacco: Not on file  . Alcohol Use: Not on file  . Drug Use: Not on file  . Sexually Active: Not on file   Other Topics Concern  . Not on file   Social History Narrative  . No  narrative on file    No family history on file.  PHYSICAL EXAM: Filed Vitals:   08/27/11 1431  BP: 104/62  Pulse: 76   General:  Well appearing. No respiratory difficulty HEENT: normal Neck: supple. no JVD. Carotids 2+ bilat; no bruits. No lymphadenopathy or thryomegaly appreciated. Cor: PMI nondisplaced. Regular rate & rhythm. Soft TR murmur noted.  No rubs, gallops. Lungs: clear Abdomen: soft, nontender, nondistended. No hepatosplenomegaly. No bruits or masses. Good bowel sounds. Extremities: no cyanosis, clubbing, rash, edema Neuro: alert & oriented x 3, cranial nerves grossly intact. moves all 4 extremities w/o difficulty. Affect pleasant.    ASSESSMENT & PLAN:

## 2011-08-27 NOTE — Progress Notes (Signed)
  Echocardiogram 2D Echocardiogram has been performed.  [name] Nira Retort 08/27/2011, 2:17 PM

## 2011-08-28 ENCOUNTER — Telehealth: Payer: Self-pay | Admitting: *Deleted

## 2011-08-28 NOTE — Telephone Encounter (Signed)
Per MD, notified pt that "echo" looks fine.

## 2011-08-31 ENCOUNTER — Other Ambulatory Visit: Payer: Self-pay | Admitting: Oncology

## 2011-09-01 ENCOUNTER — Other Ambulatory Visit: Payer: Self-pay | Admitting: Oncology

## 2011-09-03 ENCOUNTER — Other Ambulatory Visit: Payer: Self-pay | Admitting: Oncology

## 2011-09-03 ENCOUNTER — Ambulatory Visit (HOSPITAL_BASED_OUTPATIENT_CLINIC_OR_DEPARTMENT_OTHER): Payer: BC Managed Care – PPO

## 2011-09-03 ENCOUNTER — Other Ambulatory Visit: Payer: Self-pay | Admitting: *Deleted

## 2011-09-03 ENCOUNTER — Encounter: Payer: BC Managed Care – PPO | Admitting: Oncology

## 2011-09-03 ENCOUNTER — Other Ambulatory Visit (HOSPITAL_BASED_OUTPATIENT_CLINIC_OR_DEPARTMENT_OTHER): Payer: BC Managed Care – PPO | Admitting: Lab

## 2011-09-03 DIAGNOSIS — C50919 Malignant neoplasm of unspecified site of unspecified female breast: Secondary | ICD-10-CM

## 2011-09-03 DIAGNOSIS — C50419 Malignant neoplasm of upper-outer quadrant of unspecified female breast: Secondary | ICD-10-CM

## 2011-09-03 DIAGNOSIS — Z5112 Encounter for antineoplastic immunotherapy: Secondary | ICD-10-CM

## 2011-09-03 LAB — CBC WITH DIFFERENTIAL/PLATELET
BASO%: 0.5 % (ref 0.0–2.0)
Basophils Absolute: 0 10*3/uL (ref 0.0–0.1)
Eosinophils Absolute: 0 10*3/uL (ref 0.0–0.5)
HCT: 35.1 % (ref 34.8–46.6)
HGB: 12.1 g/dL (ref 11.6–15.9)
LYMPH%: 28.2 % (ref 14.0–49.7)
MONO#: 0.2 10*3/uL (ref 0.1–0.9)
NEUT#: 1.3 10*3/uL — ABNORMAL LOW (ref 1.5–6.5)
NEUT%: 60.6 % (ref 38.4–76.8)
Platelets: 86 10*3/uL — ABNORMAL LOW (ref 145–400)
WBC: 2.2 10*3/uL — ABNORMAL LOW (ref 3.9–10.3)
lymph#: 0.6 10*3/uL — ABNORMAL LOW (ref 0.9–3.3)

## 2011-09-03 LAB — BASIC METABOLIC PANEL
Calcium: 9.2 mg/dL (ref 8.4–10.5)
Sodium: 141 mEq/L (ref 135–145)

## 2011-09-03 MED ORDER — TRASTUZUMAB CHEMO INJECTION 440 MG
6.0000 mg/kg | Freq: Once | INTRAVENOUS | Status: AC
  Administered 2011-09-03: 378 mg via INTRAVENOUS
  Filled 2011-09-03: qty 18

## 2011-09-03 MED ORDER — DIPHENHYDRAMINE HCL 50 MG/ML IJ SOLN
12.5000 mg | Freq: Once | INTRAMUSCULAR | Status: AC
  Administered 2011-09-03: 12.5 mg via INTRAVENOUS

## 2011-09-03 MED ORDER — HEPARIN SOD (PORK) LOCK FLUSH 100 UNIT/ML IV SOLN
500.0000 [IU] | Freq: Once | INTRAVENOUS | Status: AC | PRN
  Administered 2011-09-03: 500 [IU]
  Filled 2011-09-03: qty 5

## 2011-09-03 MED ORDER — SODIUM CHLORIDE 0.9 % IV SOLN
Freq: Once | INTRAVENOUS | Status: AC
  Administered 2011-09-03: 11:00:00 via INTRAVENOUS

## 2011-09-03 MED ORDER — ACETAMINOPHEN 325 MG PO TABS
650.0000 mg | ORAL_TABLET | Freq: Once | ORAL | Status: AC
Start: 2011-09-03 — End: 2011-09-03
  Administered 2011-09-03: 325 mg via ORAL

## 2011-09-03 MED ORDER — SODIUM CHLORIDE 0.9 % IJ SOLN
10.0000 mL | INTRAMUSCULAR | Status: DC | PRN
  Administered 2011-09-03: 10 mL
  Filled 2011-09-03: qty 10

## 2011-09-03 MED ORDER — DIPHENHYDRAMINE HCL 25 MG PO CAPS: 50.0000 mg | ORAL_CAPSULE | Freq: Once | ORAL | Status: DC

## 2011-09-03 NOTE — Progress Notes (Signed)
Okay to treat today with wbc 2.2 anc 1.3 plt 86k per Dr Donnie Coffin.

## 2011-09-03 NOTE — Patient Instructions (Signed)
1215-Pt discharged ambulatory with next appointment confirmed.  Pt aware to call with any questions or concerns.  

## 2011-09-03 NOTE — Progress Notes (Signed)
  Hematology and Oncology Follow Up Visit  [name] [id number] [birth year] 49 y.o. 09/03/2011 10:19 AM   Principle Diagnosis: 50 yo AAF ER?PR?her2+ breast cancer, diagnosed 07/2010; s/p AC followed by taxol/herceptin followed by abraxane/herceptin. S/p lumpectomy for ypT0ypN0 on q 3 wk herceptin and tamoxifen.  Interim History:  Doing well, recent visit with dr bensimhon, 2d echo pending. No other complaints.  Medications: I have reviewed the patient's current medications.  Allergies: No Known Allergies  Past Medical History, Surgical history, Social history, and Family History were reviewed and updated.  Review of Systems: Constitutional:  Negative for fever, chills, night sweats, anorexia, weight loss, pain. Cardiovascular: no chest pain or dyspnea on exertion Respiratory: negative Neurological: negative Dermatological: negative ENT: negative Skin Gastrointestinal: negative Genito-Urinary: negative Hematological and Lymphatic: negative Breast: negative Musculoskeletal: negative Remaining ROS negative.  Physical Exam: There were no vitals taken for this visit. ECOG: 0 General appearance: alert, cooperative and appears stated age Head: Normocephalic, without obvious abnormality, atraumatic Neck: no adenopathy, no carotid bruit, no JVD, supple, symmetrical, trachea midline and thyroid not enlarged, symmetric, no tenderness/mass/nodules Lymph nodes: Cervical, supraclavicular, and axillary nodes normal. Cardiac : nl heart sounds Pulmonary:nl breath sounds Breasts:not examined Abdomen:no masses Extremitiesnormal Neuro: normal  Lab Results: Lab Results  Component Value Date   WBC 2.1* 05/21/2011   HGB 12.1 09/03/2011   HCT 35.1 09/03/2011   MCV 83.0 09/03/2011   PLT 86* 09/03/2011     Chemistry      Component Value Date/Time   NA 141 08/13/2011 0944   K 3.9 08/13/2011 0944   CL 107 08/13/2011 0944   CO2 26 08/13/2011 0944   BUN 14 08/13/2011 0944   CREATININE 0.60 08/13/2011 0944      Component Value Date/Time   CALCIUM 9.3 08/13/2011 0944   ALKPHOS 68 08/13/2011 0944   AST 19 08/13/2011 0944   ALT 10 08/13/2011 0944   BILITOT 0.6 08/13/2011 0944       Radiological Studies: chest X-ray n/a Mammogram n/a Bone density n/a  Impression and Plan: Overall doing well with herceptin, no changes in other symptoms, f/u dr Welton Flakes in 3 weeks.  More than 50% of the visit was spent in patient-related counselling   Miran Kautzman, MD 11/12/201210:19 AM

## 2011-09-04 ENCOUNTER — Encounter: Payer: Self-pay | Admitting: *Deleted

## 2011-09-04 ENCOUNTER — Telehealth: Payer: Self-pay | Admitting: *Deleted

## 2011-09-04 IMAGING — MG MM BREAST WIRE LOCALIZATION*L*
4 series · 4 of 4 positions shown · non-contrast
Comparison: none

CLINICAL DATA: The patient presents for needle localization of
left breast lesion.  Status post neoadjuvant treatment for known
left breast cancer.

[L LM (1 of 2)]
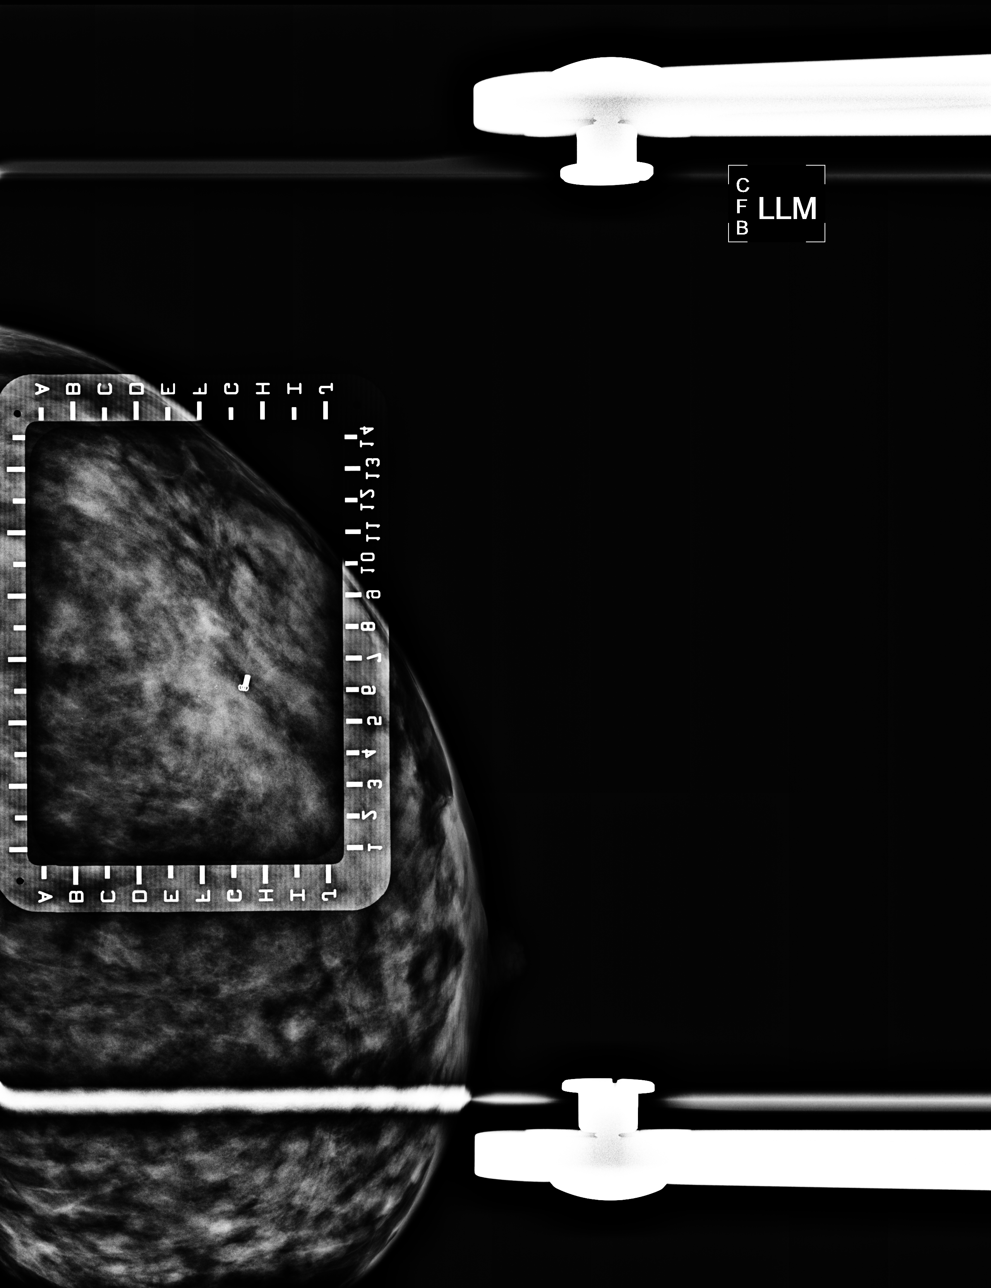

[L LM (2 of 2)]
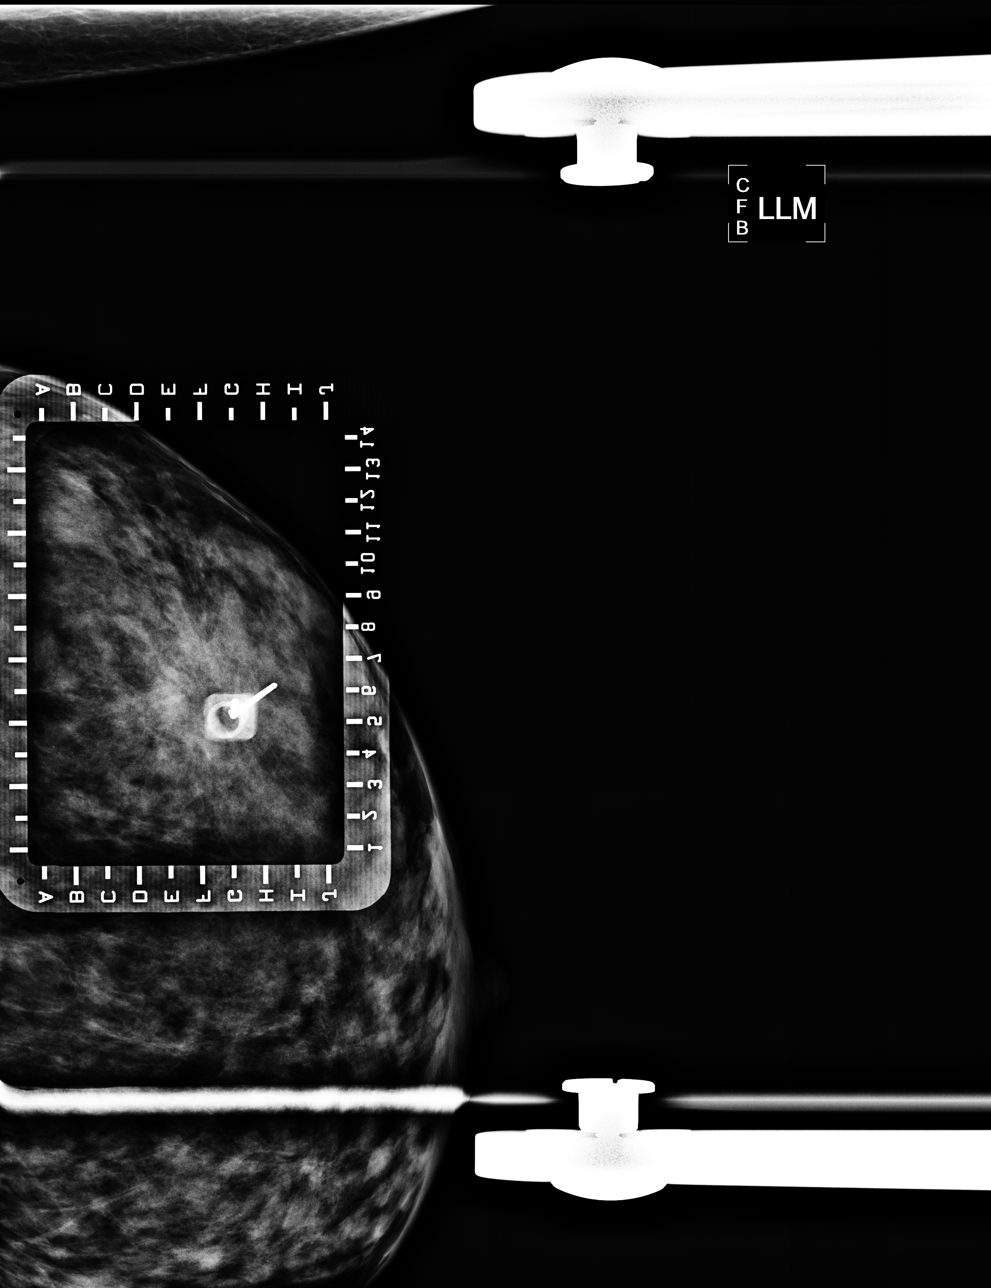

[L CC (1 of 2)]
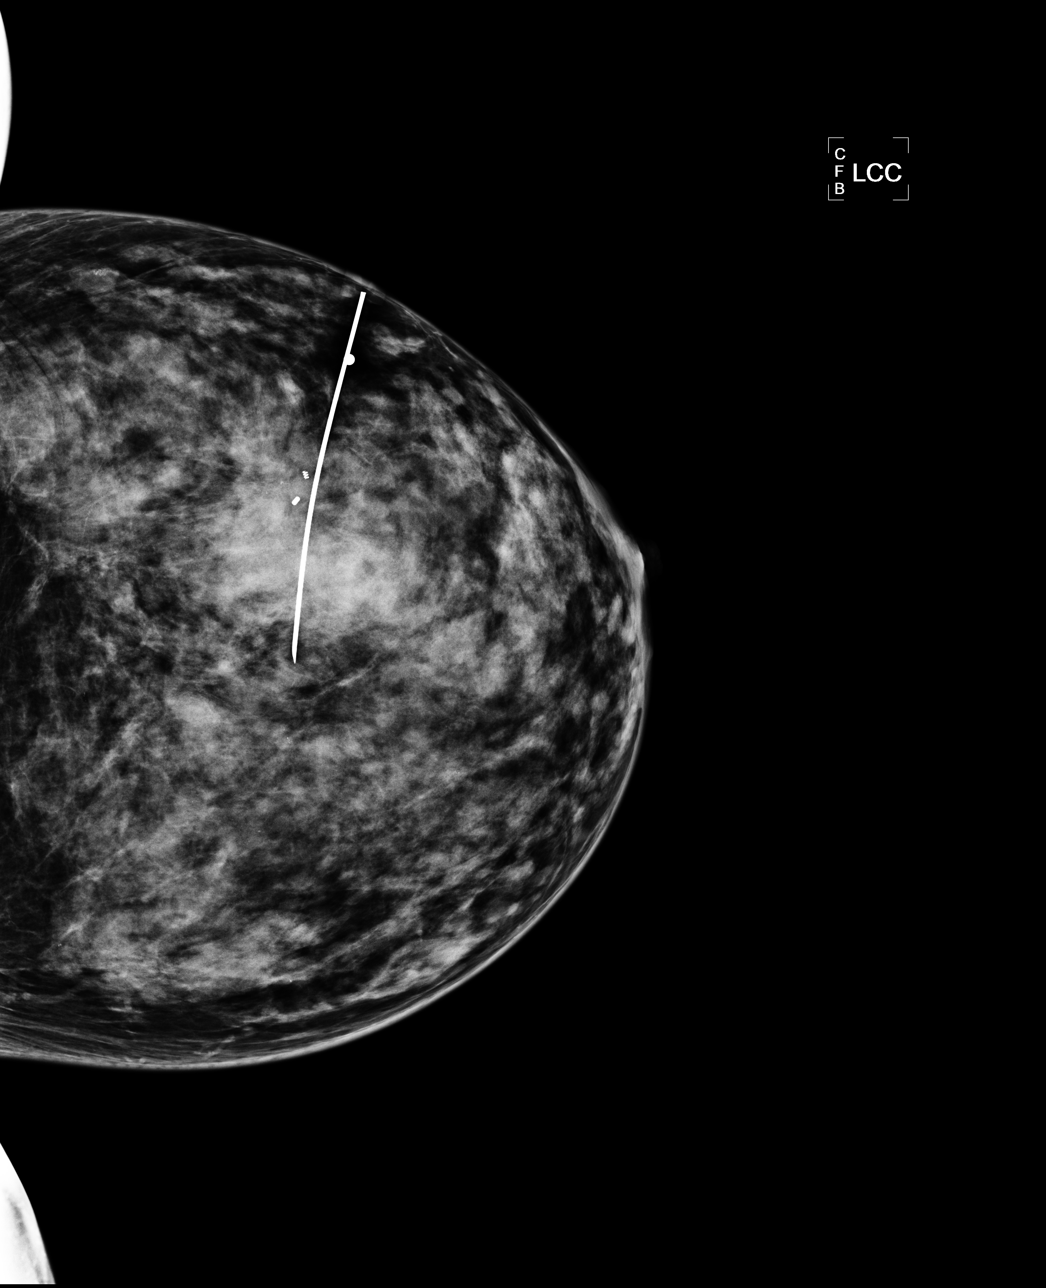

[L CC (2 of 2)]
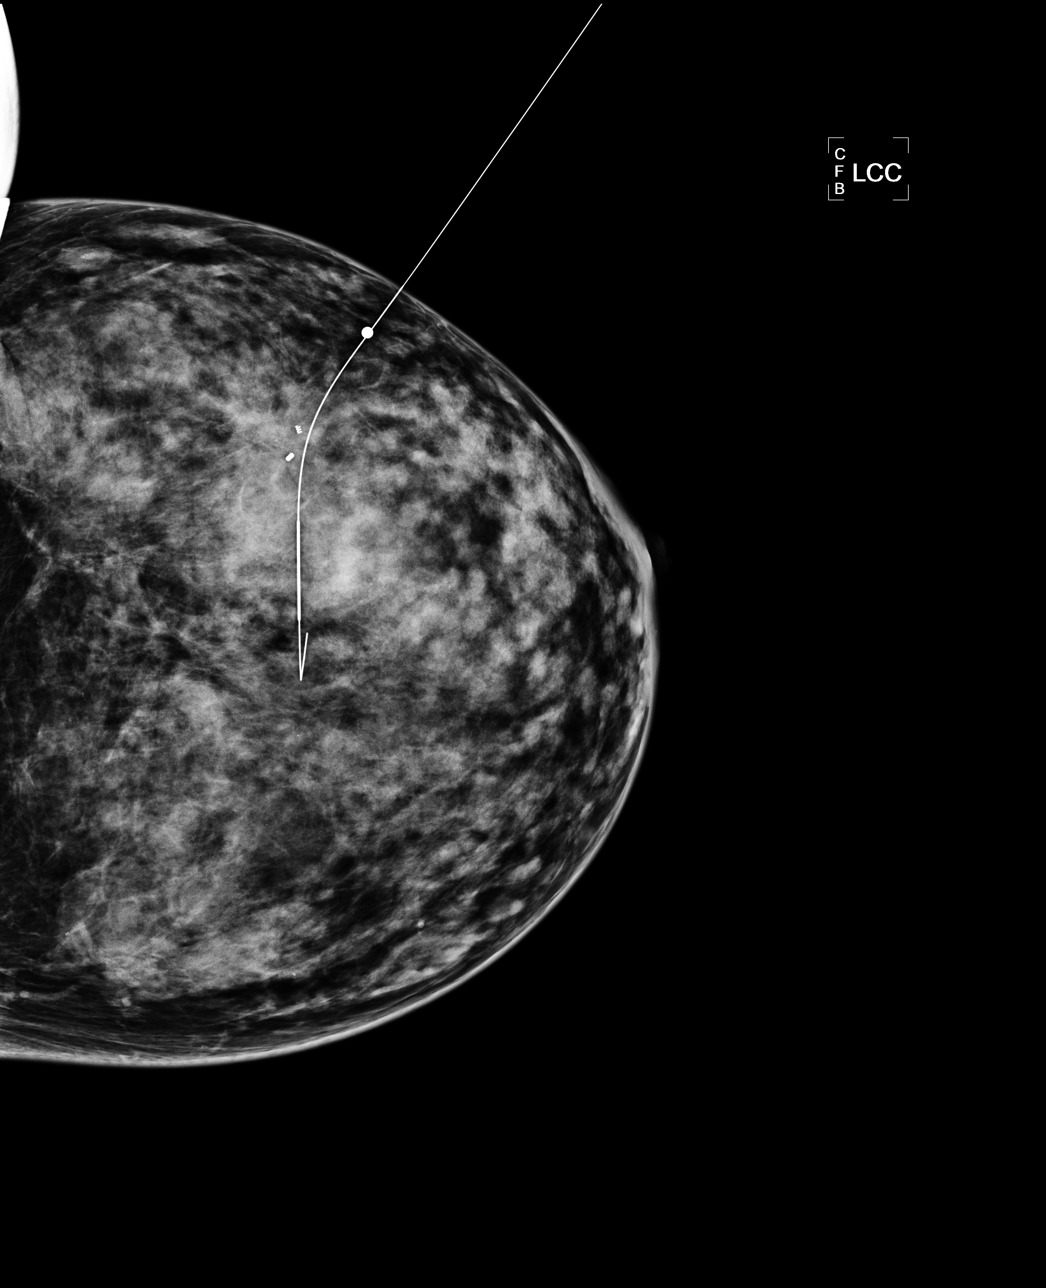

[4 of 4 positions shown; findings below may reference images not displayed]

NEEDLE LOCALIZATION WITH MAMMOGRAPHIC GUIDANCE AND SPECIMEN
RADIOGRAPH

Patient presents for needle localization prior to lumpectomy.  I
met with the patient and we discussed the procedure of needle
localization including risks, benefits, and alternatives.
Specifically, we discussed the risks of infection, bleeding, tissue
injury, and inadequate sampling. Informed written consent was
given.

Using mammographic guidance, sterile technique, 2% lidocaine and a
7 cm modified Kopans needle, clips and mass in the upper-outer
quadrant of the left breast are localized using a lateral approach.
The films are marked for Dr. Best.

Spec[facility] radiograph [facility] performed at [facility] and
confirms both clips clips and density are present in the tissue
sample.  The specimen is marked for pathology.
IMPRESSION: Needle localization of the left breast.  No apparent complications.

## 2011-09-04 IMAGING — CR DG CHEST 1V PORT
1 series · 1 of 1 positions shown · non-contrast
Comparison: 09/21/2010

CLINICAL DATA: Port placement

PORTABLE CHEST - 1 VIEW

[view not recorded]
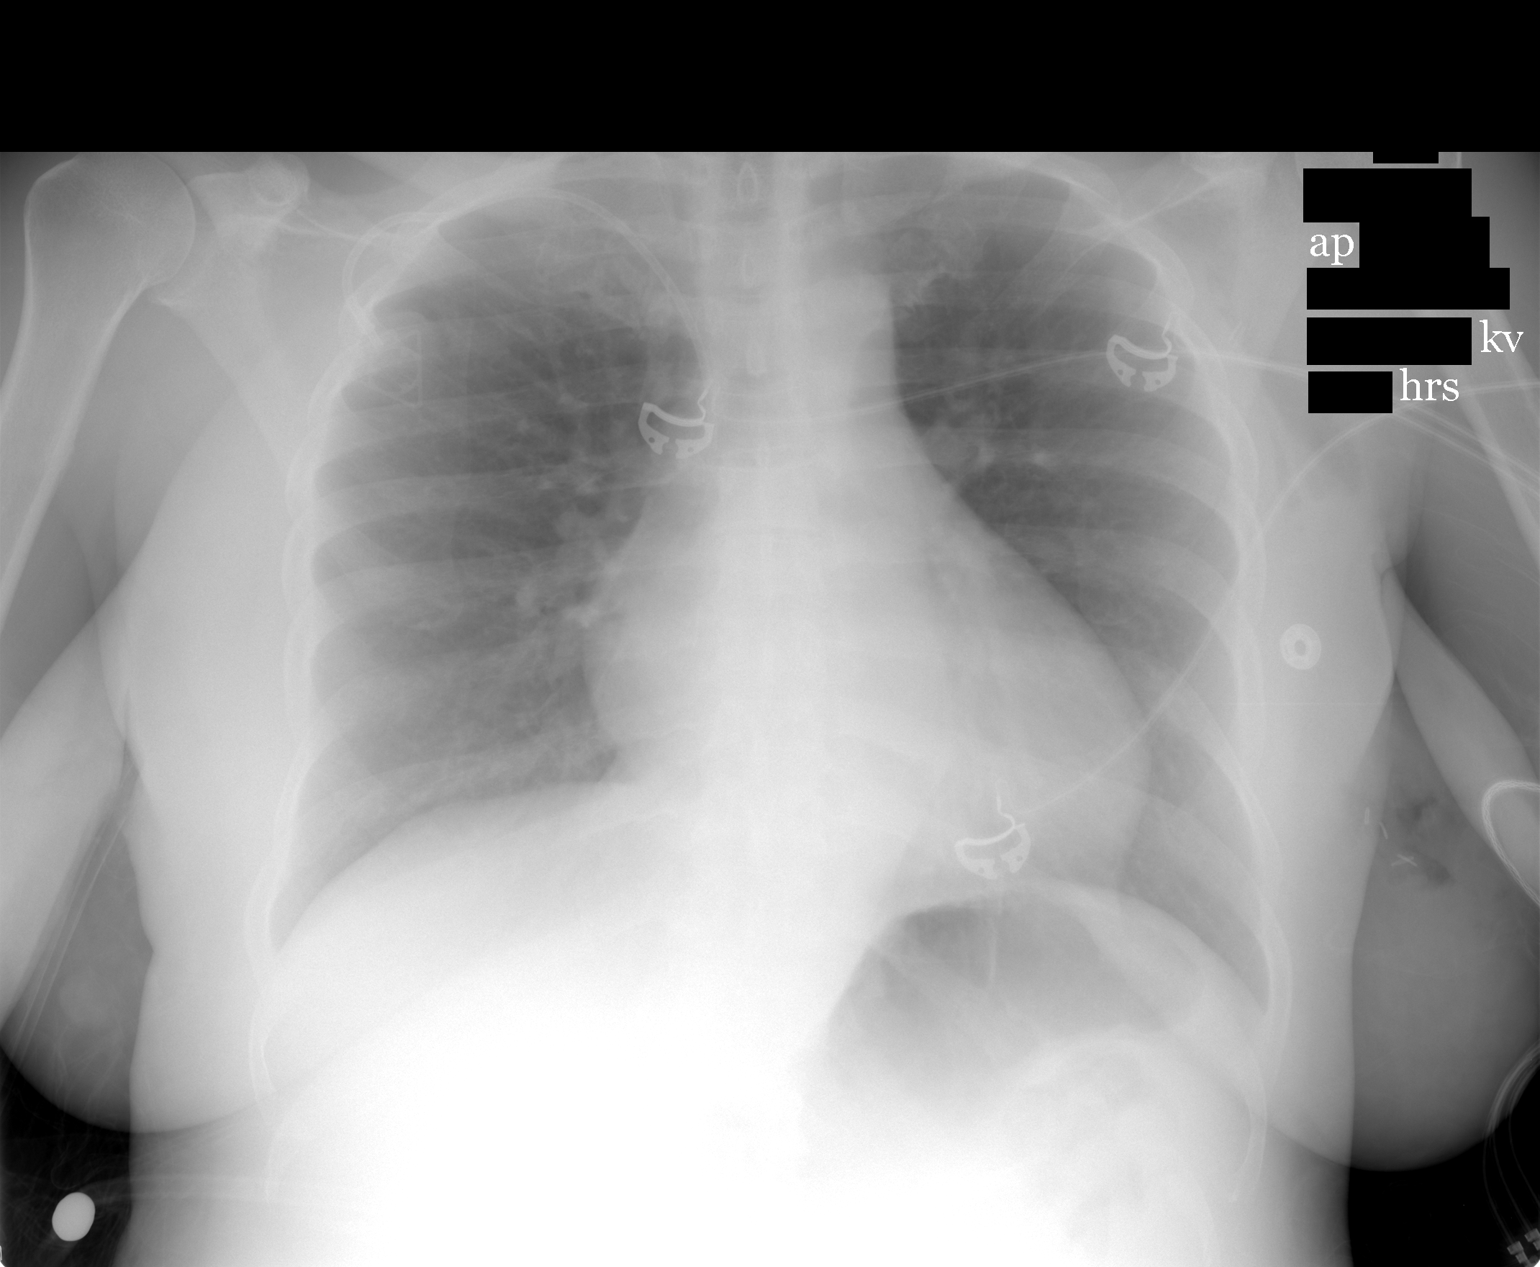

[1 of 1 positions shown; findings below may reference images not displayed]

FINDINGS: There is a new right subclavian port catheter, tip in the
mid SVC.  No pneumothorax.  Lungs are clear.  Heart size upper
limits normal.  Tortuous thoracic aorta.  No effusion.  Vascular
clips in the left breast.
IMPRESSION: 1.  Right subclavian port catheter, tip in the mid SVC.  No
pneumothorax.

## 2011-09-04 NOTE — Telephone Encounter (Signed)
[facility] SENT TO SCHEDULER TO CANCEL APPT 11/21

## 2011-09-04 NOTE — Telephone Encounter (Signed)
Pt called lmov[facility] states " I would really like to skip my treatment on 10/15/11 as I would like to spend christmas with my family." Will review with MD

## 2011-09-13 NOTE — Progress Notes (Signed)
Patient seen and examined with Nicki Bradley PA-C. We discussed all aspects of the encounter. I agree with the assessment and plan as stated above.   

## 2011-09-22 NOTE — Progress Notes (Signed)
This encounter was created in error - please disregard.

## 2011-09-24 ENCOUNTER — Ambulatory Visit (HOSPITAL_BASED_OUTPATIENT_CLINIC_OR_DEPARTMENT_OTHER): Payer: BC Managed Care – PPO | Admitting: Oncology

## 2011-09-24 ENCOUNTER — Telehealth: Payer: Self-pay | Admitting: Oncology

## 2011-09-24 ENCOUNTER — Other Ambulatory Visit: Payer: Self-pay

## 2011-09-24 ENCOUNTER — Ambulatory Visit (HOSPITAL_BASED_OUTPATIENT_CLINIC_OR_DEPARTMENT_OTHER): Payer: BC Managed Care – PPO

## 2011-09-24 ENCOUNTER — Other Ambulatory Visit (HOSPITAL_BASED_OUTPATIENT_CLINIC_OR_DEPARTMENT_OTHER): Payer: BC Managed Care – PPO | Admitting: Lab

## 2011-09-24 ENCOUNTER — Ambulatory Visit: Payer: BC Managed Care – PPO | Admitting: Physician Assistant

## 2011-09-24 VITALS — BP 135/82 | HR 76 | Temp 97.3°F | Ht 64.0 in | Wt 144.1 lb

## 2011-09-24 DIAGNOSIS — C50919 Malignant neoplasm of unspecified site of unspecified female breast: Secondary | ICD-10-CM

## 2011-09-24 DIAGNOSIS — Z5112 Encounter for antineoplastic immunotherapy: Secondary | ICD-10-CM

## 2011-09-24 DIAGNOSIS — C50419 Malignant neoplasm of upper-outer quadrant of unspecified female breast: Secondary | ICD-10-CM

## 2011-09-24 DIAGNOSIS — Z09 Encounter for follow-up examination after completed treatment for conditions other than malignant neoplasm: Secondary | ICD-10-CM

## 2011-09-24 DIAGNOSIS — Z170 Estrogen receptor positive status [ER+]: Secondary | ICD-10-CM

## 2011-09-24 DIAGNOSIS — G620 Drug-induced polyneuropathy: Secondary | ICD-10-CM

## 2011-09-24 LAB — COMPREHENSIVE METABOLIC PANEL
ALT: 13 U/L (ref 0–35)
AST: 20 U/L (ref 0–37)
Albumin: 4.2 g/dL (ref 3.5–5.2)
Alkaline Phosphatase: 70 U/L (ref 39–117)
BUN: 13 mg/dL (ref 6–23)
Potassium: 3.9 mEq/L (ref 3.5–5.3)
Sodium: 139 mEq/L (ref 135–145)

## 2011-09-24 LAB — CBC WITH DIFFERENTIAL/PLATELET
BASO%: 0.6 % (ref 0.0–2.0)
Basophils Absolute: 0 10*3/uL (ref 0.0–0.1)
HCT: 35.3 % (ref 34.8–46.6)
HGB: 12.3 g/dL (ref 11.6–15.9)
MCHC: 34.8 g/dL (ref 31.5–36.0)
MONO#: 0.2 10*3/uL (ref 0.1–0.9)
NEUT%: 66.4 % (ref 38.4–76.8)
RDW: 13.7 % (ref 11.2–14.5)
WBC: 3.3 10*3/uL — ABNORMAL LOW (ref 3.9–10.3)
lymph#: 0.9 10*3/uL (ref 0.9–3.3)

## 2011-09-24 MED ORDER — HEPARIN SOD (PORK) LOCK FLUSH 100 UNIT/ML IV SOLN
500.0000 [IU] | Freq: Once | INTRAVENOUS | Status: AC | PRN
  Administered 2011-09-24: 500 [IU]
  Filled 2011-09-24: qty 5

## 2011-09-24 MED ORDER — DIPHENHYDRAMINE HCL 25 MG PO CAPS: 50.0000 mg | ORAL_CAPSULE | Freq: Once | ORAL | Status: DC

## 2011-09-24 MED ORDER — SODIUM CHLORIDE 0.9 % IV SOLN
Freq: Once | INTRAVENOUS | Status: AC
  Administered 2011-09-24: 13:00:00 via INTRAVENOUS

## 2011-09-24 MED ORDER — TRASTUZUMAB CHEMO INJECTION 440 MG
6.0000 mg/kg | Freq: Once | INTRAVENOUS | Status: AC
  Administered 2011-09-24: 378 mg via INTRAVENOUS
  Filled 2011-09-24: qty 18

## 2011-09-24 MED ORDER — ACETAMINOPHEN 325 MG PO TABS
650.0000 mg | ORAL_TABLET | Freq: Once | ORAL | Status: AC
  Administered 2011-09-24: 325 mg via ORAL

## 2011-09-24 MED ORDER — SODIUM CHLORIDE 0.9 % IJ SOLN
10.0000 mL | INTRAMUSCULAR | Status: DC | PRN
  Administered 2011-09-24: 10 mL
  Filled 2011-09-24: qty 10

## 2011-09-24 MED ORDER — DIPHENHYDRAMINE HCL 50 MG/ML IJ SOLN
12.5000 mg | Freq: Once | INTRAMUSCULAR | Status: AC
  Administered 2011-09-24: 12:00:00 via INTRAVENOUS

## 2011-09-24 NOTE — Telephone Encounter (Signed)
gv pt appt for dec2012 °

## 2011-09-24 NOTE — Progress Notes (Signed)
OFFICE PROGRESS NOTE Dr. 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 She was initially treated with neoadjuvant chemotherapy for a 3.5 cm node positive stage IIB disease.  PRIOR THERAPY:  #1 status post 4 cycles of dose dense Adriamycin Cytoxan followed by Taxol Herceptin then Abraxane and Herceptin do to intolerable side effects from the Taxol specifically neuropathy. She completed a 12 week course of therapy.  #2 status post left lumpectomy with sentinel node dissection revealing T0 N0 disease. Patient had a complete pathologic response.  #3 vision then went on to have radiation therapy with concomitant Herceptin.  #4 patient is now being maintained on Herceptin every 3 weeks. She is due to complete all of her therapy in March 2013.  #5 patient is receiving concurrent tamoxifen 20 mg daily since completion of her radiation.  CURRENT THERAPY: Patient is now on tamoxifen 20 mg daily with Herceptin given every 3 weeks.  INTERVAL HISTORY: [name] 50 y.o. female returns for followup visit. Overall she is doing well she is tells me that her peripheral neuropathy is significantly improved. She denies any nausea vomiting fevers chills night sweats headaches shortness of breath chest pains palpitations. She is continuing to work out every day for a whole hour. She is tolerating this quite well. She is also working full-time. She does tell me that she gets fatigued but otherwise is able to carry on her activities of daily living her major the 10 point review of systems is negative.  MEDICAL HISTORY: Past Medical History  Diagnosis Date  . Breast cancer Oct 2011  . Peripheral neuropathy     ALLERGIES:   has no known allergies.  MEDICATIONS:  Current Outpatient Prescriptions  Medication Sig Dispense Refill  . Multiple Vitamin (MULTIVITAMIN)  capsule Take 1 capsule by mouth daily.        . tamoxifen (NOLVADEX) 20 MG tablet Take 20 mg by mouth daily.         No current facility-administered medications for this visit.   Facility-Administered Medications Ordered in Other Visits  Medication Dose Route Frequency Provider Last Rate Last Dose  . 0.9 %  sodium chloride infusion   Intravenous Once Victorino December, MD      . acetaminophen (TYLENOL) tablet 650 mg  650 mg Oral Once Victorino December, MD   325 mg at 09/24/11 1221  . diphenhydrAMINE (BENADRYL) injection 12.5 mg  12.5 mg Intravenous Once Victorino December, MD      . heparin lock flush 100 unit/mL  500 Units Intracatheter Once PRN Victorino December, MD      . sodium chloride 0.9 % injection 10 mL  10 mL Intracatheter PRN Victorino December, MD      . trastuzumab (HERCEPTIN) 378 mg in sodium chloride 0.9 % 250 mL chemo infusion  6 mg/kg (Treatment Plan Actual) Intravenous Once Victorino December, MD 536 mL/hr at 09/24/11 1248 378 mg at 09/24/11 1248  . DISCONTD: diphenhydrAMINE (BENADRYL) capsule 50 mg  50 mg Oral Once Victorino December, MD        SURGICAL HISTORY:  Past Surgical History  Procedure Date  . Breast lumpectomy     REVIEW OF SYSTEMS:  Pertinent items are noted in HPI.   PHYSICAL EXAMINATION: General appearance: alert, cooperative and appears stated age Head: Normocephalic, without obvious abnormality, atraumatic Neck: no adenopathy, no carotid bruit, no JVD, supple, symmetrical,  trachea midline and thyroid not enlarged, symmetric, no tenderness/mass/nodules Lymph nodes: Cervical, supraclavicular, and axillary nodes normal. Resp: clear to auscultation bilaterally and normal percussion bilaterally Back: symmetric, no curvature. ROM normal. No CVA tenderness. Cardio: regular rate and rhythm, S1, S2 normal, no murmur, click, rub or gallop and normal apical impulse GI: soft, non-tender; bowel sounds normal; no masses,  no organomegaly Extremities: extremities normal,  atraumatic, no cyanosis or edema Neurologic: Alert and oriented X 3, normal strength and tone. Normal symmetric reflexes. Normal coordination and gait  ECOG PERFORMANCE STATUS: 1 - Symptomatic but completely ambulatory  Blood pressure 135/82, pulse 76, temperature 97.3 F (36.3 C), temperature source Oral, height 5\' 4"  (1.626 m), weight 144 lb 1.6 oz (65.363 kg).  LABORATORY DATA: Lab Results  Component Value Date   WBC 3.3* 09/24/2011   HGB 12.3 09/24/2011   HCT 35.3 09/24/2011   MCV 82.9 09/24/2011   PLT 97* 09/24/2011      Chemistry      Component Value Date/Time   NA 141 09/03/2011 0932   K 3.8 09/03/2011 0932   CL 107 09/03/2011 0932   CO2 26 09/03/2011 0932   BUN 13 09/03/2011 0932   CREATININE 0.63 09/03/2011 0932      Component Value Date/Time   CALCIUM 9.2 09/03/2011 0932   ALKPHOS 68 08/13/2011 0944   AST 19 08/13/2011 0944   ALT 10 08/13/2011 0944   BILITOT 0.6 08/13/2011 0944       RADIOGRAPHIC STUDIES:  No results found.  ASSESSMENT: 50 year old female with stage IIB ER/PR p Abraxane therapy. She then underwent a lumpectomy with sentinel node biopsy. The final pathology showed a pathologic response. She went on to then received radiation therapy with Herceptin. She is now on tamoxifen and Herceptin. Tamoxifen is 20 mg daily. With Herceptin being given every 3 weeks. She will complete all of her therapy, March 2013. Once she completes the maintenance Herceptin she will continue to tamoxifen 20 mg daily for a total of 5 years. Patient's neuropathic pain is improved significantly she is not taking Neurontin at this time. She continues to work and exercise and is quite happy with her overall well-being.   PLAN:  proceed with scheduled Herceptin today. She will continue the tamoxifen she is instructed. She knows to call with any problems.    All questions were answered. The patient knows to call the clinic with any problems, questions or concerns. We can certainly  see the patient much sooner if necessary.  I spent 20 minutes counseling the patient face to face. The total time spent in the appointment was 30 minutes.    Drue Second, MD Medical/Oncology [facility] [phone] (beeper) [phone] (Office)  09/24/2011, 1:27 PM

## 2011-10-15 ENCOUNTER — Other Ambulatory Visit: Payer: BC Managed Care – PPO | Admitting: Lab

## 2011-10-15 ENCOUNTER — Other Ambulatory Visit (HOSPITAL_BASED_OUTPATIENT_CLINIC_OR_DEPARTMENT_OTHER): Payer: BC Managed Care – PPO | Admitting: Lab

## 2011-10-15 ENCOUNTER — Ambulatory Visit: Payer: BC Managed Care – PPO

## 2011-10-15 ENCOUNTER — Other Ambulatory Visit: Payer: Self-pay

## 2011-10-15 ENCOUNTER — Ambulatory Visit (HOSPITAL_BASED_OUTPATIENT_CLINIC_OR_DEPARTMENT_OTHER): Payer: BC Managed Care – PPO | Admitting: Oncology

## 2011-10-15 ENCOUNTER — Ambulatory Visit (HOSPITAL_BASED_OUTPATIENT_CLINIC_OR_DEPARTMENT_OTHER): Payer: BC Managed Care – PPO

## 2011-10-15 ENCOUNTER — Other Ambulatory Visit: Payer: Self-pay | Admitting: *Deleted

## 2011-10-15 ENCOUNTER — Encounter: Payer: Self-pay | Admitting: Oncology

## 2011-10-15 ENCOUNTER — Ambulatory Visit: Payer: BC Managed Care – PPO | Admitting: Physician Assistant

## 2011-10-15 VITALS — BP 127/79 | HR 67 | Temp 97.9°F | Ht 64.0 in | Wt 142.6 lb

## 2011-10-15 DIAGNOSIS — R110 Nausea: Secondary | ICD-10-CM

## 2011-10-15 DIAGNOSIS — C50919 Malignant neoplasm of unspecified site of unspecified female breast: Secondary | ICD-10-CM

## 2011-10-15 DIAGNOSIS — C50419 Malignant neoplasm of upper-outer quadrant of unspecified female breast: Secondary | ICD-10-CM

## 2011-10-15 DIAGNOSIS — Z5112 Encounter for antineoplastic immunotherapy: Secondary | ICD-10-CM

## 2011-10-15 DIAGNOSIS — Z170 Estrogen receptor positive status [ER+]: Secondary | ICD-10-CM

## 2011-10-15 LAB — CBC WITH DIFFERENTIAL/PLATELET
BASO%: 0.3 % (ref 0.0–2.0)
Basophils Absolute: 0 10*3/uL (ref 0.0–0.1)
EOS%: 1 % (ref 0.0–7.0)
HGB: 13.4 g/dL (ref 11.6–15.9)
MCH: 28.9 pg (ref 25.1–34.0)
MCHC: 34.2 g/dL (ref 31.5–36.0)
MCV: 84.7 fL (ref 79.5–101.0)
MONO%: 9.5 % (ref 0.0–14.0)
NEUT%: 59 % (ref 38.4–76.8)
RDW: 14.4 % (ref 11.2–14.5)

## 2011-10-15 MED ORDER — TRASTUZUMAB CHEMO INJECTION 440 MG
6.0000 mg/kg | Freq: Once | INTRAVENOUS | Status: AC
  Administered 2011-10-15: 378 mg via INTRAVENOUS
  Filled 2011-10-15: qty 18

## 2011-10-15 MED ORDER — HEPARIN SOD (PORK) LOCK FLUSH 100 UNIT/ML IV SOLN
500.0000 [IU] | Freq: Once | INTRAVENOUS | Status: AC | PRN
  Administered 2011-10-15: 500 [IU]
  Filled 2011-10-15: qty 5

## 2011-10-15 MED ORDER — ACETAMINOPHEN 325 MG PO TABS
650.0000 mg | ORAL_TABLET | Freq: Once | ORAL | Status: AC
  Administered 2011-10-15: 325 mg via ORAL

## 2011-10-15 MED ORDER — DIPHENHYDRAMINE HCL 50 MG/ML IJ SOLN
12.5000 mg | Freq: Once | INTRAMUSCULAR | Status: AC
  Administered 2011-10-15: 12.5 mg via INTRAVENOUS

## 2011-10-15 MED ORDER — SODIUM CHLORIDE 0.9 % IJ SOLN
10.0000 mL | INTRAMUSCULAR | Status: DC | PRN
  Administered 2011-10-15: 10 mL
  Filled 2011-10-15: qty 10

## 2011-10-15 MED ORDER — SODIUM CHLORIDE 0.9 % IV SOLN
Freq: Once | INTRAVENOUS | Status: AC
  Administered 2011-10-15: 09:00:00 via INTRAVENOUS

## 2011-10-15 NOTE — Progress Notes (Signed)
OFFICE PROGRESS NOTE Dr. 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 She was initially treated with neoadjuvant chemotherapy for a 3.5 cm node positive stage IIB disease.  PRIOR THERAPY:  #1 status post 4 cycles of dose dense Adriamycin Cytoxan followed by Taxol Herceptin then Abraxane and Herceptin do to intolerable side effects from the Taxol specifically neuropathy. She completed a 12 week course of therapy.  #2 status post left lumpectomy with sentinel node dissection revealing T0 N0 disease. Patient had a complete pathologic response.  #3 vision then went on to have radiation therapy with concomitant Herceptin.  #4 patient is now being maintained on Herceptin every 3 weeks. She is due to complete all of her therapy in March 2013.  #5 patient is receiving concurrent tamoxifen 20 mg daily since completion of her radiation.  CURRENT THERAPY: Patient is now on tamoxifen 20 mg daily with Herceptin given every 3 weeks.  INTERVAL HISTORY: [name] 50 y.o. female returns for followup visit without any new complaints. Overall she is doing well she is tells me that her peripheral neuropathy is significantly improved. She denies any nausea vomiting fevers chills night sweats headaches shortness of breath chest pains palpitations. She is continuing to work out every day for a whole hour. She is tolerating this quite well. She is also working full-time. She does tell me that she gets fatigued but otherwise is able to carry on her activities of daily living her major the 10 point review of systems is negative. She has an appointment set up with Dr. Gala Romney next month.  MEDICAL HISTORY: Past Medical History  Diagnosis Date  . Breast cancer Oct 2011  . Peripheral neuropathy     ALLERGIES:   has no known allergies.  MEDICATIONS:  Current  Outpatient Prescriptions  Medication Sig Dispense Refill  . Multiple Vitamin (MULTIVITAMIN) capsule Take 1 capsule by mouth daily.        . tamoxifen (NOLVADEX) 20 MG tablet Take 20 mg by mouth daily.          SURGICAL HISTORY:  Past Surgical History  Procedure Date  . Breast lumpectomy     REVIEW OF SYSTEMS:  Pertinent items are noted in HPI.   PHYSICAL EXAMINATION: General appearance: alert, cooperative and appears stated age Head: Normocephalic, without obvious abnormality, atraumatic Neck: no adenopathy, no carotid bruit, no JVD, supple, symmetrical, trachea midline and thyroid not enlarged, symmetric, no tenderness/mass/nodules Lymph nodes: Cervical, supraclavicular, and axillary nodes normal. Resp: clear to auscultation bilaterally and normal percussion bilaterally Back: symmetric, no curvature. ROM normal. No CVA tenderness. Cardio: regular rate and rhythm, S1, S2 normal, no murmur, click, rub or gallop and normal apical impulse GI: soft, non-tender; bowel sounds normal; no masses,  no organomegaly Extremities: extremities normal, atraumatic, no cyanosis or edema Neurologic: Alert and oriented X 3, normal strength and tone. Normal symmetric reflexes. Normal coordination and gait  ECOG PERFORMANCE STATUS: 1 - Symptomatic but completely ambulatory  Blood pressure 127/79, pulse 67, temperature 97.9 F (36.6 C), height 5\' 4"  (1.626 m), weight 142 lb 9.6 oz (64.683 kg).  LABORATORY DATA: Lab Results  Component Value Date   WBC 3.1* 10/15/2011   HGB 13.4 10/15/2011   HCT 39.2 10/15/2011   MCV 84.7 10/15/2011   PLT 101* 10/15/2011      Chemistry      Component Value Date/Time   NA 139 09/24/2011 1039  K 3.9 09/24/2011 1039   CL 104 09/24/2011 1039   CO2 27 09/24/2011 1039   BUN 13 09/24/2011 1039   CREATININE 0.57 09/24/2011 1039      Component Value Date/Time   CALCIUM 9.2 09/24/2011 1039   ALKPHOS 70 09/24/2011 1039   AST 20 09/24/2011 1039   ALT 13 09/24/2011 1039    BILITOT 0.6 09/24/2011 1039       RADIOGRAPHIC STUDIES:  No results found.  ASSESSMENT: 50 year old female with stage IIB ER/PR p Abraxane therapy. She then underwent a lumpectomy with sentinel node biopsy. The final pathology showed a pathologic response. She went on to then received radiation therapy with Herceptin. She is now on tamoxifen and Herceptin. Tamoxifen is 20 mg daily. With Herceptin being given every 3 weeks. She will complete all of her therapy, March 2013. Once she completes the maintenance Herceptin she will continue to tamoxifen 20 mg daily for a total of 5 years. Patient's neuropathic pain is improved significantly she is not taking Neurontin at this time. She continues to work and exercise and is quite happy with her overall well-being.   PLAN:  proceed with scheduled Herceptin today. She will continue the tamoxifen she is instructed. She knows to call with any problems.    All questions were answered. The patient knows to call the clinic with any problems, questions or concerns. We can certainly see the patient much sooner if necessary.  I spent 20 minutes counseling the patient face to face. The total time spent in the appointment was 30 minutes.    Drue Second, MD Medical/Oncology [facility] [phone] (beeper) [phone] (Office)  10/15/2011, 9:10 AM

## 2011-10-15 NOTE — Patient Instructions (Signed)
1040 Pt ambulatory upon discharge and verbalized understanding of next appt date.

## 2011-10-18 ENCOUNTER — Other Ambulatory Visit: Payer: BC Managed Care – PPO

## 2011-10-18 ENCOUNTER — Ambulatory Visit: Payer: BC Managed Care – PPO | Admitting: Physician Assistant

## 2011-10-25 ENCOUNTER — Other Ambulatory Visit (HOSPITAL_COMMUNITY): Payer: Self-pay | Admitting: Adult Health

## 2011-10-25 ENCOUNTER — Other Ambulatory Visit: Payer: Self-pay | Admitting: Physician Assistant

## 2011-10-25 DIAGNOSIS — C50919 Malignant neoplasm of unspecified site of unspecified female breast: Secondary | ICD-10-CM

## 2011-10-29 ENCOUNTER — Telehealth: Payer: Self-pay | Admitting: *Deleted

## 2011-10-29 NOTE — Telephone Encounter (Signed)
Pt called l[facility] "I'm having back pain and legs are so stiff I can barely walk. Reviewed with MD-pt to stop Tamoxifen until next appt with MD on 11/26/11.

## 2011-10-30 ENCOUNTER — Telehealth: Payer: Self-pay | Admitting: Oncology

## 2011-10-30 NOTE — Telephone Encounter (Signed)
called pt [name] that her appt time on 11/05/2011 to start @ 8:15am to start

## 2011-11-05 ENCOUNTER — Other Ambulatory Visit: Payer: BC Managed Care – PPO | Admitting: Lab

## 2011-11-05 ENCOUNTER — Other Ambulatory Visit: Payer: Self-pay

## 2011-11-05 ENCOUNTER — Ambulatory Visit: Payer: BC Managed Care – PPO | Admitting: Physician Assistant

## 2011-11-05 ENCOUNTER — Ambulatory Visit (HOSPITAL_BASED_OUTPATIENT_CLINIC_OR_DEPARTMENT_OTHER): Payer: BC Managed Care – PPO

## 2011-11-05 ENCOUNTER — Ambulatory Visit: Payer: BC Managed Care – PPO | Admitting: Oncology

## 2011-11-05 VITALS — BP 146/78 | HR 67 | Temp 98.3°F | Ht 64.0 in | Wt 141.9 lb

## 2011-11-05 DIAGNOSIS — C50919 Malignant neoplasm of unspecified site of unspecified female breast: Secondary | ICD-10-CM

## 2011-11-05 DIAGNOSIS — R110 Nausea: Secondary | ICD-10-CM

## 2011-11-05 DIAGNOSIS — Z170 Estrogen receptor positive status [ER+]: Secondary | ICD-10-CM

## 2011-11-05 DIAGNOSIS — Z5111 Encounter for antineoplastic chemotherapy: Secondary | ICD-10-CM

## 2011-11-05 DIAGNOSIS — C50419 Malignant neoplasm of upper-outer quadrant of unspecified female breast: Secondary | ICD-10-CM

## 2011-11-05 LAB — CBC WITH DIFFERENTIAL/PLATELET
Basophils Absolute: 0 10*3/uL (ref 0.0–0.1)
EOS%: 0.8 % (ref 0.0–7.0)
Eosinophils Absolute: 0 10*3/uL (ref 0.0–0.5)
HCT: 37 % (ref 34.8–46.6)
HGB: 12.7 g/dL (ref 11.6–15.9)
MONO#: 0.3 10*3/uL (ref 0.1–0.9)
NEUT#: 1.4 10*3/uL — ABNORMAL LOW (ref 1.5–6.5)
NEUT%: 56 % (ref 38.4–76.8)
RDW: 13.7 % (ref 11.2–14.5)
WBC: 2.4 10*3/uL — ABNORMAL LOW (ref 3.9–10.3)
lymph#: 0.8 10*3/uL — ABNORMAL LOW (ref 0.9–3.3)

## 2011-11-05 LAB — COMPREHENSIVE METABOLIC PANEL
ALT: 12 U/L (ref 0–35)
AST: 22 U/L (ref 0–37)
Albumin: 4.4 g/dL (ref 3.5–5.2)
Alkaline Phosphatase: 60 U/L (ref 39–117)
Calcium: 9 mg/dL (ref 8.4–10.5)
Chloride: 104 mEq/L (ref 96–112)
Potassium: 4.1 mEq/L (ref 3.5–5.3)

## 2011-11-05 MED ORDER — ACETAMINOPHEN 325 MG PO TABS
650.0000 mg | ORAL_TABLET | Freq: Once | ORAL | Status: AC
  Administered 2011-11-05: 325 mg via ORAL

## 2011-11-05 MED ORDER — SODIUM CHLORIDE 0.9 % IJ SOLN
10.0000 mL | INTRAMUSCULAR | Status: DC | PRN
  Administered 2011-11-05: 10 mL
  Filled 2011-11-05: qty 10

## 2011-11-05 MED ORDER — HEPARIN SOD (PORK) LOCK FLUSH 100 UNIT/ML IV SOLN
500.0000 [IU] | Freq: Once | INTRAVENOUS | Status: AC | PRN
  Administered 2011-11-05: 500 [IU]
  Filled 2011-11-05: qty 5

## 2011-11-05 MED ORDER — DIPHENHYDRAMINE HCL 50 MG/ML IJ SOLN
12.5000 mg | Freq: Once | INTRAMUSCULAR | Status: AC
  Administered 2011-11-05: 12.5 mg via INTRAVENOUS

## 2011-11-05 MED ORDER — TRASTUZUMAB CHEMO INJECTION 440 MG
6.0000 mg/kg | Freq: Once | INTRAVENOUS | Status: AC
  Administered 2011-11-05: 378 mg via INTRAVENOUS
  Filled 2011-11-05: qty 18

## 2011-11-05 MED ORDER — SODIUM CHLORIDE 0.9 % IV SOLN
Freq: Once | INTRAVENOUS | Status: AC
  Administered 2011-11-05: 11:00:00 via INTRAVENOUS

## 2011-11-05 NOTE — Progress Notes (Signed)
OFFICE PROGRESS NOTE Dr. 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 She was initially treated with neoadjuvant chemotherapy for a 3.5 cm node positive stage IIB disease.  PRIOR THERAPY:  #1 status post 4 cycles of dose dense Adriamycin Cytoxan followed by Taxol Herceptin then Abraxane and Herceptin do to intolerable side effects from the Taxol specifically neuropathy. She completed a 12 week course of therapy.  #2 status post left lumpectomy with sentinel node dissection revealing T0 N0 disease. Patient had a complete pathologic response.  #3 vision then went on to have radiation therapy with concomitant Herceptin.  #4 patient is now being maintained on Herceptin every 3 weeks. She is due to complete all of her therapy in March 2013.  #5 patient is receiving concurrent tamoxifen 20 mg daily since completion of her radiation.  CURRENT THERAPY: Patient is now on tamoxifen 20 mg daily with Herceptin given every 3 weeks.  INTERVAL HISTORY: [name] 51 y.o. female returns for followup visit without any new complaints. Patient did call us last week complaining of having generalized achiness in her back her legs. She felt that it was the tamoxifen and therefore she discontinued it. Since then her aches and pains have disappeared. She today overall feels well she is denying any headaches double vision blurring of vision difficulty in swallowing she has no shortness of breath chest pains palpitations she denies any myalgias or arthralgias no hematuria hematochezia melena hemoptysis or hematemesis. She has no peripheral paresthesias. She denies any vaginal discharge any bleeding. She is off of the tamoxifen at this time. Patient tells me she does not want to take tamoxifen. I discussed with her at great length the importance of taking tamoxifen to help  prevent the cancer from coming back. She understands that she has high-risk disease. However she does not wish to be on tamoxifen at this time. Remainder of the 10 point review of systems is negative.    MEDICAL HISTORY: Past Medical History  Diagnosis Date  . Breast cancer Oct 2011  . Peripheral neuropathy     ALLERGIES:   has no known allergies.  MEDICATIONS:  Current Outpatient Prescriptions  Medication Sig Dispense Refill  . Multiple Vitamin (MULTIVITAMIN) capsule Take 1 capsule by mouth daily.        . tamoxifen (NOLVADEX) 20 MG tablet Take 20 mg by mouth daily.          SURGICAL HISTORY:  Past Surgical History  Procedure Date  . Breast lumpectomy     REVIEW OF SYSTEMS:  Pertinent items are noted in HPI.   PHYSICAL EXAMINATION: General appearance: alert, cooperative and appears stated age Head: Normocephalic, without obvious abnormality, atraumatic Neck: no adenopathy, no carotid bruit, no JVD, supple, symmetrical, trachea midline and thyroid not enlarged, symmetric, no tenderness/mass/nodules Lymph nodes: Cervical, supraclavicular, and axillary nodes normal. Resp: clear to auscultation bilaterally and normal percussion bilaterally Back: symmetric, no curvature. ROM normal. No CVA tenderness. Cardio: regular rate and rhythm, S1, S2 normal, no murmur, click, rub or gallop and normal apical impulse GI: soft, non-tender; bowel sounds normal; no masses,  no organomegaly Extremities: extremities normal, atraumatic, no cyanosis or edema Neurologic: Alert and oriented X 3, normal strength and tone. Normal symmetric reflexes. Normal coordination and gait  ECOG PERFORMANCE STATUS: 1 - Symptomatic but completely ambulatory  Blood pressure 146/78, pulse 67, temperature 98.3 F (36.8 C), temperature source Oral, height  5\' 4"  (1.626 m), weight 141 lb 14.4 oz (64.365 kg).  LABORATORY DATA: Lab Results  Component Value Date   WBC 2.4* 11/05/2011   HGB 12.7 11/05/2011   HCT 37.0  11/05/2011   MCV 83.9 11/05/2011   PLT 86* 11/05/2011      Chemistry      Component Value Date/Time   NA 139 09/24/2011 1039   K 3.9 09/24/2011 1039   CL 104 09/24/2011 1039   CO2 27 09/24/2011 1039   BUN 13 09/24/2011 1039   CREATININE 0.57 09/24/2011 1039      Component Value Date/Time   CALCIUM 9.2 09/24/2011 1039   ALKPHOS 70 09/24/2011 1039   AST 20 09/24/2011 1039   ALT 13 09/24/2011 1039   BILITOT 0.6 09/24/2011 1039       RADIOGRAPHIC STUDIES:  No results found.  ASSESSMENT: 51 year old female with stage IIB ER/PR and her2Neu positive breast cancer. S/p neoadjuvant chemotherapy. She then underwent a lumpectomy with sentinel node biopsy. The final pathology showed a pathologic response. She went on to then received radiation therapy with Herceptin. She is now on tamoxifen and Herceptin. Patient discontinue the tamoxifen do to achiness. After discussing this with the patient at great length and of looking at her blood work I do think that she most likely had a viral illness that caused her myalgias and arthralgias. Today we did discuss the importance of being on tamoxifen. Patient at this time is not willing to take anything other than the tamoxifen. After an extensive discussion she did finally agree to taking the tamoxifen when she visits me next time in February. Am hoping by then all of the residual effect of the viral illness may have disappeared and she will do well with the tamoxifen.  PLAN:  proceed with scheduled Herceptin today. Patient will see me back in 3 weeks' time. She knows to call any problems. She knows to call with any problems.    All questions were answered. The patient knows to call the clinic with any problems, questions or concerns. We can certainly see the patient much sooner if necessary.  I spent 20 minutes counseling the patient face to face. The total time spent in the appointment was 30 minutes.    Drue Second, MD Medical/Oncology [facility] [phone] (beeper) [phone] (Office)  11/05/2011, 10:21 AM

## 2011-11-19 ENCOUNTER — Ambulatory Visit (HOSPITAL_COMMUNITY)
Admission: RE | Admit: 2011-11-19 | Discharge: 2011-11-19 | Disposition: A | Discharge status: Home / Self Care | Payer: BC Managed Care – PPO | Source: Ambulatory Visit | Attending: Internal Medicine | Admitting: Internal Medicine

## 2011-11-19 VITALS — BP 112/64 | HR 71 | Wt 140.5 lb

## 2011-11-19 DIAGNOSIS — I059 Rheumatic mitral valve disease, unspecified: Secondary | ICD-10-CM | POA: Insufficient documentation

## 2011-11-19 DIAGNOSIS — Z09 Encounter for follow-up examination after completed treatment for conditions other than malignant neoplasm: Secondary | ICD-10-CM | POA: Insufficient documentation

## 2011-11-19 DIAGNOSIS — C50919 Malignant neoplasm of unspecified site of unspecified female breast: Secondary | ICD-10-CM

## 2011-11-19 NOTE — Assessment & Plan Note (Addendum)
Echo reviewed in detail with her. ECHO  50-55% Lateral S' 11. No evidence of cardiotoxicity.  Follow up in April for repeat ECHO post treatment.   Patient seen and examined with Tonye Becket, NP. We discussed all aspects of the encounter. I agree with the assessment and plan as stated above. Tolerating Herceptin well. Treatment to finish soon. Will get repeat echo after therapy complete.

## 2011-11-19 NOTE — Progress Notes (Signed)
  Echocardiogram 2D Echocardiogram has been performed.  Jorje Guild [facility] 11/19/2011, 10:55 AM

## 2011-11-19 NOTE — Progress Notes (Signed)
Patient ID: [name], female   DOB: December 10, 1960, 51 y.o.   MRN: [id number] HPI: [name] is a 51 yo female with locally advanced, stage IIB, ER/PR-positive, HER2 positive invasive ductal carcinoma of the left breast. She received neoadjuvant chemotherapy with 4 cycles of Adriamycin/Cytoxan, followed by Tal/Herceptin then switched to Abraxane/Herceptin. Status post left breast needle localized partial mastectomy with sentinel node dissection revealing pathologic staging T0N0. She completed radiation and is now on q3 week maintenance Herceptin until March 2013 and tamoxifen 20 mg daily for five years. She was referred to Dr. Gala Romney to follow echocardiograms.   Echos:  1. December 2011: EF 60-65% with lateral S' velocity of not available to calculate  2. June 2012: EF 55-60% with lateral S' velocity  9.4  3. November 2012: EF appox 55% with lateral S' velocity  9.5 (full report to follow)  4. November 19, 2011 EF 55% lateral S' velocity 11.3  She is here for follow up.   She denies SOB/orthopnea/PND. Denies lower extremity edema. Appetite good.       ROS: All systems negative except as listed in HPI, PMH and Problem List.  Past Medical History  Diagnosis Date  . Breast cancer Oct 2011  . Peripheral neuropathy     Current Outpatient Prescriptions  Medication Sig Dispense Refill  . Multiple Vitamin (MULTIVITAMIN) capsule Take 1 capsule by mouth daily.        . tamoxifen (NOLVADEX) 20 MG tablet Take 20 mg by mouth daily.           PHYSICAL EXAM: Filed Vitals:   11/19/11 1014  BP: 112/64  Pulse: 71   Weight change:   140.8 pounds General:  Well appearing. No resp difficulty HEENT: normal Neck: supple. JVP flat. Carotids 2+ bilaterally; no bruits. No lymphadenopathy or thryomegaly appreciated. Cor: PMI normal. Regular rate & rhythm. No rubs, gallops or murmurs. Lungs: clear Abdomen: soft, nontender, nondistended. No hepatosplenomegaly. No bruits or masses. Good bowel  sounds. Extremities: no cyanosis, clubbing, rash, edema Neuro: alert & orientedx3, cranial nerves grossly intact. Moves all 4 extremities w/o difficulty. Affect pleasant.    ECG:   ASSESSMENT & PLAN:

## 2011-11-19 NOTE — Patient Instructions (Addendum)
Follow up in April 2013 with  ECHO

## 2011-11-25 NOTE — Progress Notes (Signed)
Patient seen and examined with Amy Clegg, NP. We discussed all aspects of the encounter. I agree with the assessment and plan as stated above.   

## 2011-11-26 ENCOUNTER — Ambulatory Visit (HOSPITAL_BASED_OUTPATIENT_CLINIC_OR_DEPARTMENT_OTHER): Payer: BC Managed Care – PPO

## 2011-11-26 ENCOUNTER — Encounter: Payer: Self-pay | Admitting: Family

## 2011-11-26 ENCOUNTER — Ambulatory Visit (HOSPITAL_BASED_OUTPATIENT_CLINIC_OR_DEPARTMENT_OTHER): Payer: BC Managed Care – PPO | Admitting: Family

## 2011-11-26 ENCOUNTER — Other Ambulatory Visit: Payer: BC Managed Care – PPO | Admitting: Lab

## 2011-11-26 VITALS — BP 135/81 | HR 71 | Temp 97.6°F | Ht 64.0 in | Wt 144.2 lb

## 2011-11-26 DIAGNOSIS — Z5111 Encounter for antineoplastic chemotherapy: Secondary | ICD-10-CM

## 2011-11-26 DIAGNOSIS — C50919 Malignant neoplasm of unspecified site of unspecified female breast: Secondary | ICD-10-CM

## 2011-11-26 LAB — CBC WITH DIFFERENTIAL/PLATELET
BASO%: 0 % (ref 0.0–2.0)
EOS%: 0.7 % (ref 0.0–7.0)
HCT: 35.2 % (ref 34.8–46.6)
LYMPH%: 22.3 % (ref 14.0–49.7)
MCH: 28.9 pg (ref 25.1–34.0)
MCHC: 34.4 g/dL (ref 31.5–36.0)
MCV: 84 fL (ref 79.5–101.0)
NEUT%: 61.5 % (ref 38.4–76.8)
Platelets: 80 10*3/uL — ABNORMAL LOW (ref 145–400)

## 2011-11-26 LAB — BASIC METABOLIC PANEL
BUN: 19 mg/dL (ref 6–23)
Chloride: 106 mEq/L (ref 96–112)
Creatinine, Ser: 0.61 mg/dL (ref 0.50–1.10)

## 2011-11-26 MED ORDER — HEPARIN SOD (PORK) LOCK FLUSH 100 UNIT/ML IV SOLN
500.0000 [IU] | Freq: Once | INTRAVENOUS | Status: AC | PRN
  Administered 2011-11-26: 500 [IU]
  Filled 2011-11-26: qty 5

## 2011-11-26 MED ORDER — TRASTUZUMAB CHEMO INJECTION 440 MG
6.0000 mg/kg | Freq: Once | INTRAVENOUS | Status: AC
  Administered 2011-11-26: 378 mg via INTRAVENOUS
  Filled 2011-11-26: qty 18

## 2011-11-26 MED ORDER — DIPHENHYDRAMINE HCL 50 MG/ML IJ SOLN
12.5000 mg | Freq: Once | INTRAMUSCULAR | Status: AC
  Administered 2011-11-26: 12.5 mg via INTRAVENOUS

## 2011-11-26 MED ORDER — SODIUM CHLORIDE 0.9 % IV SOLN
Freq: Once | INTRAVENOUS | Status: AC
  Administered 2011-11-26: 11:00:00 via INTRAVENOUS

## 2011-11-26 MED ORDER — ACETAMINOPHEN 325 MG PO TABS
650.0000 mg | ORAL_TABLET | Freq: Once | ORAL | Status: AC
  Administered 2011-11-26: 325 mg via ORAL

## 2011-11-26 MED ORDER — SODIUM CHLORIDE 0.9 % IJ SOLN
10.0000 mL | INTRAMUSCULAR | Status: DC | PRN
  Administered 2011-11-26: 10 mL
  Filled 2011-11-26: qty 10

## 2011-11-26 NOTE — Progress Notes (Signed)
[facility]  Name: [name]                  DATE: 11/26/2011 MRN: [id number]                      DOB: 02-23-1961  REFERRING PHYSICIAN: No ref. provider found  DIAGNOSIS: Patient Active Problem List  Diagnoses Date Noted  . Invasive ductal carcinoma of breast, stage 2 08/27/2011     Encounter Diagnosis  Name Primary?  . Breast cancer Yes  Locally advanced, 3.5 cm, node positive, Stage IIB, ER positive PR positive HER-2/neu positive infiltrating ductal carcinoma, left breast diagnosed October 2011.   PRIOR THERAPY:  1. 4 cycles of dose dense Adriamycin Cytoxan followed by Taxol Herceptin, then Abraxane and Herceptin due to intolerable side effects from the Taxol specifically neuropathy. She completed a 12 week course of therapy.  2. Left lumpectomy with sentinel node dissection revealing T0 N0 disease, complete pathologic response.  3. Radiation therapy with concomitant Herceptin.    CURRENT THERAPY:  1. Maintenance Herceptin every 3 weeks, due to complete therapy in March 2013.  2. Concurrent tamoxifen 20 mg daily since completion of radiation.with Herceptin given every 3 weeks.   INTERIM HISTORY:  Upon entering the room, pt asks "Where is Dr. Welton Flakes?". I explain Dr. Welton Flakes is in the clinic and I offer to have her talk to the pt at the end of the visit, if she would like. She admits to being late arriving for her appointment and was delayed further in registration. Her appt was 9:30, she was checked in at 10:18. She is standing at the mirror applying make-up and does not stop during the interview.  No problems since seen last, in fact is feeling much improved since discontinuing Tamoxifen. This was discontinued at the last visit with Dr. Welton Flakes, due to significant arthralgias/myalgias. Pt states "I could either walk or take a chance on my breast cancer coming back. I'll take the chance of my breast coming back because I cannot take Tamoxifen". I explain the high risk nature  of her breast cancer, but she is adamant about not taking Tamoxifen. I offer that often after a drug holiday, with reinitiation, the symptoms either do not recur, or are less severe. Again she strongly refuses to take Tamoxifen under any circumstances. She asks to speak to Dr. Welton Flakes to "get her advice". I pull up Dr. Milta Deiters note and read aloud the Plan section of the progress note which states that the patient will discontinue Tamoxifen and reinitiate in February (this visit) and she states "that isn't true". She angrily states "I don't even want to be here, I almost didn't come today, I am just so over all this". She does not make eye contact during conversation.   I review results of Echo done 1/28, satisfactory to continue treatment. I offer to get Dr. Welton Flakes for discussion with her and offer something to drink while she waits. She refuses.   PHYSICAL EXAM: BP 135/81  Pulse 71  Temp(Src) 97.6 F (36.4 C) (Oral)  Ht 5\' 4"  (1.626 m)  Wt 144 lb 3.2 oz (65.409 kg)  BMI 24.75 kg/m2 General: Well developed, well nourished, in no acute distress.  EENT: No ocular or oral lesions. No stomatitis.  Respiratory: Lungs are clear to auscultation bilaterally with normal respiratory movement and no accessory muscle use. Cardiac: No murmur, rub or tachycardia. No upper or lower extremity edema.  GI: Abdomen is soft,  no palpable hepatosplenomegaly. No fluid wave. No tenderness. Musculoskeletal: No kyphosis, no tenderness over the spine, ribs or hips. Lymph: No cervical, infraclavicular, axillary or inguinal adenopathy. Neuro: No focal neurological deficits. Psych: Alert and oriented X 3, appropriate mood and affect.    LABORATORY STUDIES:   Results for orders placed in visit on 11/26/11  CBC WITH DIFFERENTIAL      Component Value Range   WBC 2.8 (*) 3.9 - 10.3 (10e3/uL)   NEUT# 1.7  1.5 - 6.5 (10e3/uL)   HGB 12.1  11.6 - 15.9 (g/dL)   HCT 24.4  01.0 - 27.2 (%)   Platelets 80 (*) 145 - 400 (10e3/uL)    MCV 84.0  79.5 - 101.0 (fL)   MCH 28.9  25.1 - 34.0 (pg)   MCHC 34.4  31.5 - 36.0 (g/dL)   RBC 5.36  6.44 - 0.34 (10e6/uL)   RDW 13.5  11.2 - 14.5 (%)   lymph# 0.6 (*) 0.9 - 3.3 (10e3/uL)   MONO# 0.4  0.1 - 0.9 (10e3/uL)   Eosinophils Absolute 0.0  0.0 - 0.5 (10e3/uL)   Basophils Absolute 0.0  0.0 - 0.1 (10e3/uL)   NEUT% 61.5  38.4 - 76.8 (%)   LYMPH% 22.3  14.0 - 49.7 (%)   MONO% 15.5 (*) 0.0 - 14.0 (%)   EOS% 0.7  0.0 - 7.0 (%)   BASO% 0.0  0.0 - 2.0 (%)   nRBC 0  0 - 0 (%)    IMPRESSION:  51 y/o black female with:  1. History of Stage IIB left breast cancer, post neoadjuvant chemo, lumpectomy, now on maintenance Herceptin.  2. Poor tolerance of Tamoxifen with significant arthralgias/myalgias. Has discontinued and declines opportunity to reinitiate.  3. Frustrated and angry that her appointment was switched to me, instead of seeing Dr. Welton Flakes. I am unable to placate her mood.   PLAN:   1. Appointment with Dr. Welton Flakes 2/25 prior to next treatment. 2. Treatment today. After today, she will have 2 additional treatments of Herceptin.  3. Remain off Tamoxifen at her insistence.   Dr. Welton Flakes spends 10 minutes in discussion regarding Tamoxifen and the benefits to her. Again she refuses.

## 2011-12-17 ENCOUNTER — Ambulatory Visit: Payer: BC Managed Care – PPO | Admitting: Oncology

## 2011-12-17 ENCOUNTER — Other Ambulatory Visit (HOSPITAL_BASED_OUTPATIENT_CLINIC_OR_DEPARTMENT_OTHER): Payer: BC Managed Care – PPO | Admitting: Lab

## 2011-12-17 ENCOUNTER — Ambulatory Visit: Payer: BC Managed Care – PPO | Admitting: Physician Assistant

## 2011-12-17 ENCOUNTER — Ambulatory Visit (HOSPITAL_BASED_OUTPATIENT_CLINIC_OR_DEPARTMENT_OTHER): Payer: BC Managed Care – PPO

## 2011-12-17 ENCOUNTER — Encounter: Payer: Self-pay | Admitting: Oncology

## 2011-12-17 VITALS — BP 131/85 | HR 67 | Temp 98.0°F | Ht 64.0 in | Wt 142.3 lb

## 2011-12-17 DIAGNOSIS — Z5112 Encounter for antineoplastic immunotherapy: Secondary | ICD-10-CM

## 2011-12-17 DIAGNOSIS — C50919 Malignant neoplasm of unspecified site of unspecified female breast: Secondary | ICD-10-CM

## 2011-12-17 LAB — CBC WITH DIFFERENTIAL/PLATELET
BASO%: 0 % (ref 0.0–2.0)
EOS%: 1.3 % (ref 0.0–7.0)
HCT: 37.6 % (ref 34.8–46.6)
LYMPH%: 30.7 % (ref 14.0–49.7)
MCH: 29.4 pg (ref 25.1–34.0)
MCHC: 35.1 g/dL (ref 31.5–36.0)
NEUT%: 55.7 % (ref 38.4–76.8)
Platelets: 114 10*3/uL — ABNORMAL LOW (ref 145–400)
lymph#: 1 10*3/uL (ref 0.9–3.3)

## 2011-12-17 LAB — BASIC METABOLIC PANEL
BUN: 16 mg/dL (ref 6–23)
Calcium: 9.7 mg/dL (ref 8.4–10.5)
Creatinine, Ser: 0.62 mg/dL (ref 0.50–1.10)

## 2011-12-17 MED ORDER — HEPARIN SOD (PORK) LOCK FLUSH 100 UNIT/ML IV SOLN
500.0000 [IU] | Freq: Once | INTRAVENOUS | Status: AC | PRN
  Administered 2011-12-17: 500 [IU]
  Filled 2011-12-17: qty 5

## 2011-12-17 MED ORDER — DIPHENHYDRAMINE HCL 50 MG/ML IJ SOLN
12.5000 mg | Freq: Once | INTRAMUSCULAR | Status: AC
  Administered 2011-12-17: 12.5 mg via INTRAVENOUS

## 2011-12-17 MED ORDER — SODIUM CHLORIDE 0.9 % IJ SOLN
10.0000 mL | INTRAMUSCULAR | Status: DC | PRN
  Administered 2011-12-17: 10 mL
  Filled 2011-12-17: qty 10

## 2011-12-17 MED ORDER — TRASTUZUMAB CHEMO INJECTION 440 MG
6.0000 mg/kg | Freq: Once | INTRAVENOUS | Status: AC
  Administered 2011-12-17: 378 mg via INTRAVENOUS
  Filled 2011-12-17: qty 18

## 2011-12-17 MED ORDER — SODIUM CHLORIDE 0.9 % IV SOLN
Freq: Once | INTRAVENOUS | Status: AC
  Administered 2011-12-17: 10:00:00 via INTRAVENOUS

## 2011-12-17 MED ORDER — ACETAMINOPHEN 325 MG PO TABS
650.0000 mg | ORAL_TABLET | Freq: Once | ORAL | Status: AC
  Administered 2011-12-17: 325 mg via ORAL

## 2011-12-17 NOTE — Patient Instructions (Signed)
[facility] Discharge Instructions for Patients Receiving Chemotherapy  Today you received the following chemotherapy agents Herceptin  To help prevent nausea and vomiting after your treatment, we encourage you to take your nausea medication as directed by MD   If you develop nausea and vomiting that is not controlled by your nausea medication, call the clinic. If it is after clinic hours your family physician or the after hours number for the clinic or go to the Emergency Department.   BELOW ARE SYMPTOMS THAT SHOULD BE REPORTED IMMEDIATELY:  *FEVER GREATER THAN 100.5 F  *CHILLS WITH OR WITHOUT FEVER  NAUSEA AND VOMITING THAT IS NOT CONTROLLED WITH YOUR NAUSEA MEDICATION  *UNUSUAL SHORTNESS OF BREATH  *UNUSUAL BRUISING OR BLEEDING  TENDERNESS IN MOUTH AND THROAT WITH OR WITHOUT PRESENCE OF ULCERS  *URINARY PROBLEMS  *BOWEL PROBLEMS  UNUSUAL RASH Items with * indicate a potential emergency and should be followed up as soon as possible.  One of the nurses will contact you 24 hours after your first treatment. Please let the nurse know about any problems that you may have experienced. Feel free to call the clinic you have any questions or concerns. The clinic phone number is [phone].   I have been informed and understand all the instructions given to me. I know to contact the clinic, my physician, or go to the Emergency Department if any problems should occur. I do not have any questions at this time, but understand that I may call the clinic during office hours   should I have any questions or need assistance in obtaining follow up care.    __________________________________________  _____________  __________ Signature of Patient or Authorized Representative            Date                   Time    __________________________________________ Nurse's Signature    

## 2011-12-17 NOTE — Patient Instructions (Signed)
1. You are doing well. 2. We will proceed with your scheduled treatment today. 3. You will return to the office in 3 weeks for your last Herceptin 4. Call with any problems or questions.  Lab Results  Component Value Date   WBC 3.1* 12/17/2011   HGB 13.2 12/17/2011   HCT 37.6 12/17/2011   MCV 83.7 12/17/2011   PLT 114* 12/17/2011

## 2011-12-17 NOTE — Progress Notes (Signed)
OFFICE PROGRESS NOTE Dr. 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 She was initially treated with neoadjuvant chemotherapy for a 3.5 cm node positive stage IIB disease.  PRIOR THERAPY:  #1 status post 4 cycles of dose dense Adriamycin Cytoxan followed by Taxol Herceptin then Abraxane and Herceptin do to intolerable side effects from the Taxol specifically neuropathy. She completed a 12 week course of therapy.  #2 status post left lumpectomy with sentinel node dissection revealing T0 N0 disease. Patient had a complete pathologic response.  #3 vision then went on to have radiation therapy with concomitant Herceptin.  #4 patient is now being maintained on Herceptin every 3 weeks. She is due to complete all of her therapy in March 2013.  #5 patient is receiving concurrent tamoxifen 20 mg daily since completion of her radiation.  CURRENT THERAPY: Patient is now on tamoxifen 20 mg daily with Herceptin given every 3 weeks.  INTERVAL HISTORY: [name] 51 y.o. female returns for followup visit without any new complaints. She continues to have problems with her legs especially the knee joints. I think this may just be do to overuse from her work as well as her exercise routine. No nausea or vomiting, no fevers or chills. No headaches, she has joint pains bilaterally in the knee. Remainder of the 10 point review of systems is negative.    MEDICAL HISTORY: Past Medical History  Diagnosis Date  . Breast cancer Oct 2011  . Peripheral neuropathy     ALLERGIES:   has no known allergies.  MEDICATIONS:  Current Outpatient Prescriptions  Medication Sig Dispense Refill  . Multiple Vitamin (MULTIVITAMIN) capsule Take 1 capsule by mouth daily.        . tamoxifen (NOLVADEX) 20 MG tablet Take 20 mg by mouth daily.          SURGICAL HISTORY:  Past  Surgical History  Procedure Date  . Breast lumpectomy     REVIEW OF SYSTEMS:  Pertinent items are noted in HPI.   PHYSICAL EXAMINATION: General appearance: alert, cooperative and appears stated age Head: Normocephalic, without obvious abnormality, atraumatic Neck: no adenopathy, no carotid bruit, no JVD, supple, symmetrical, trachea midline and thyroid not enlarged, symmetric, no tenderness/mass/nodules Lymph nodes: Cervical, supraclavicular, and axillary nodes normal. Resp: clear to auscultation bilaterally and normal percussion bilaterally Back: symmetric, no curvature. ROM normal. No CVA tenderness. Cardio: regular rate and rhythm, S1, S2 normal, no murmur, click, rub or gallop and normal apical impulse GI: soft, non-tender; bowel sounds normal; no masses,  no organomegaly Extremities: extremities normal, atraumatic, no cyanosis or edema Neurologic: Alert and oriented X 3, normal strength and tone. Normal symmetric reflexes. Normal coordination and gait  ECOG PERFORMANCE STATUS: 1 - Symptomatic but completely ambulatory  Blood pressure 131/85, pulse 67, temperature 98 F (36.7 C), temperature source Oral, height 5\' 4"  (1.626 m), weight 142 lb 4.8 oz (64.547 kg).  LABORATORY DATA: Lab Results  Component Value Date   WBC 3.1* 12/17/2011   HGB 13.2 12/17/2011   HCT 37.6 12/17/2011   MCV 83.7 12/17/2011   PLT 114* 12/17/2011      Chemistry      Component Value Date/Time   NA 140 11/26/2011 1012   K 3.9 11/26/2011 1012   CL 106 11/26/2011 1012   CO2 26 11/26/2011 1012   BUN 19 11/26/2011 1012   CREATININE 0.61 11/26/2011 1012  Component Value Date/Time   CALCIUM 9.1 11/26/2011 1012   ALKPHOS 60 11/05/2011 0921   AST 22 11/05/2011 0921   ALT 12 11/05/2011 0921   BILITOT 0.8 11/05/2011 0921       RADIOGRAPHIC STUDIES:  No results found.  ASSESSMENT: 51 year old female with:  1.  stage IIB ER/PR and her2Neu positive breast cancer. S/p neoadjuvant chemotherapy. She then underwent a  lumpectomy with sentinel node biopsy. The final pathology showed a pathologic response.   2. She went on to then received radiation therapy with Herceptin. She is now on tamoxifen and Herceptin. Patient discontinue the tamoxifen do to achiness. After discussing this with the patient at great length and of looking at her blood work I do think that she most likely had a viral illness that caused her myalgias and arthralgias.   3.Today we did discuss the importance of being on tamoxifen. Patient at this time is not willing to take anything other than the tamoxifen. After an extensive discussion she did finally agree to taking the tamoxifen when she visits me next time in March.    PLAN:   1. roceed with scheduled Herceptin today.   2. Patient will see me back in 3 weeks' time.   3. We will plan to start Tamoxifen after she completes herceptin. We discussed breaking up the dose to 10mg  am and 10 mg pm to see if she will tolerate it better.     All questions were answered. The patient knows to call the clinic with any problems, questions or concerns. We can certainly see the patient much sooner if necessary.  I spent counseling the patient face to face. The total time spent in the appointment was 30 minutes.    Drue Second, MD Medical/Oncology [facility] [phone] (beeper) [phone] (Office)  12/17/2011, 9:18 AM

## 2012-01-07 ENCOUNTER — Ambulatory Visit (HOSPITAL_BASED_OUTPATIENT_CLINIC_OR_DEPARTMENT_OTHER): Payer: BC Managed Care – PPO | Admitting: Family

## 2012-01-07 ENCOUNTER — Ambulatory Visit (HOSPITAL_BASED_OUTPATIENT_CLINIC_OR_DEPARTMENT_OTHER): Payer: BC Managed Care – PPO

## 2012-01-07 ENCOUNTER — Other Ambulatory Visit (HOSPITAL_BASED_OUTPATIENT_CLINIC_OR_DEPARTMENT_OTHER): Payer: BC Managed Care – PPO | Admitting: Lab

## 2012-01-07 ENCOUNTER — Telehealth: Payer: Self-pay | Admitting: Oncology

## 2012-01-07 ENCOUNTER — Ambulatory Visit: Payer: BC Managed Care – PPO | Admitting: Physician Assistant

## 2012-01-07 VITALS — BP 116/76 | HR 66 | Temp 97.3°F | Ht 64.0 in | Wt 142.1 lb

## 2012-01-07 DIAGNOSIS — C50919 Malignant neoplasm of unspecified site of unspecified female breast: Secondary | ICD-10-CM

## 2012-01-07 DIAGNOSIS — IMO0001 Reserved for inherently not codable concepts without codable children: Secondary | ICD-10-CM

## 2012-01-07 DIAGNOSIS — Z5112 Encounter for antineoplastic immunotherapy: Secondary | ICD-10-CM

## 2012-01-07 DIAGNOSIS — C50419 Malignant neoplasm of upper-outer quadrant of unspecified female breast: Secondary | ICD-10-CM

## 2012-01-07 LAB — CBC WITH DIFFERENTIAL/PLATELET
Basophils Absolute: 0 10*3/uL (ref 0.0–0.1)
EOS%: 1 % (ref 0.0–7.0)
Eosinophils Absolute: 0 10*3/uL (ref 0.0–0.5)
HCT: 37.6 % (ref 34.8–46.6)
HGB: 13.1 g/dL (ref 11.6–15.9)
MCH: 29.6 pg (ref 25.1–34.0)
MCV: 84.9 fL (ref 79.5–101.0)
NEUT#: 1.8 10*3/uL (ref 1.5–6.5)
NEUT%: 61.3 % (ref 38.4–76.8)
lymph#: 0.9 10*3/uL (ref 0.9–3.3)

## 2012-01-07 LAB — BASIC METABOLIC PANEL
BUN: 14 mg/dL (ref 6–23)
CO2: 23 mEq/L (ref 19–32)
Glucose, Bld: 86 mg/dL (ref 70–99)
Potassium: 3.7 mEq/L (ref 3.5–5.3)
Sodium: 142 mEq/L (ref 135–145)

## 2012-01-07 MED ORDER — TRASTUZUMAB CHEMO INJECTION 440 MG
6.0000 mg/kg | Freq: Once | INTRAVENOUS | Status: AC
  Administered 2012-01-07: 378 mg via INTRAVENOUS
  Filled 2012-01-07: qty 18

## 2012-01-07 MED ORDER — SODIUM CHLORIDE 0.9 % IJ SOLN
10.0000 mL | INTRAMUSCULAR | Status: DC | PRN
  Administered 2012-01-07: 10 mL
  Filled 2012-01-07: qty 10

## 2012-01-07 MED ORDER — ACETAMINOPHEN 325 MG PO TABS
650.0000 mg | ORAL_TABLET | Freq: Once | ORAL | Status: AC
  Administered 2012-01-07: 325 mg via ORAL

## 2012-01-07 MED ORDER — HEPARIN SOD (PORK) LOCK FLUSH 100 UNIT/ML IV SOLN
500.0000 [IU] | Freq: Once | INTRAVENOUS | Status: AC | PRN
  Administered 2012-01-07: 500 [IU]
  Filled 2012-01-07: qty 5

## 2012-01-07 MED ORDER — DIPHENHYDRAMINE HCL 50 MG/ML IJ SOLN
12.5000 mg | Freq: Once | INTRAMUSCULAR | Status: AC
  Administered 2012-01-07: 10:00:00 via INTRAVENOUS

## 2012-01-07 MED ORDER — SODIUM CHLORIDE 0.9 % IV SOLN
Freq: Once | INTRAVENOUS | Status: AC
  Administered 2012-01-07: 10:00:00 via INTRAVENOUS

## 2012-01-07 NOTE — Telephone Encounter (Signed)
gve the pt her sept 2013 appt calendar °

## 2012-01-08 ENCOUNTER — Encounter: Payer: Self-pay | Admitting: *Deleted

## 2012-01-08 ENCOUNTER — Telehealth: Payer: Self-pay | Admitting: *Deleted

## 2012-01-08 ENCOUNTER — Encounter: Payer: Self-pay | Admitting: Family

## 2012-01-08 NOTE — Progress Notes (Unsigned)
Per MD, notified pt that she may re- start her tamoxifen, and that a scheduler would be calling with an appt to see Dr Luisa Hart for porta a cath removal

## 2012-01-08 NOTE — Progress Notes (Signed)
[facility]  Name: [name]                  DATE: 01/08/2012 MRN: [id number]                      DOB: [birth year]  REFERRING PHYSICIAN: No ref. provider found  DIAGNOSIS: Patient Active Problem List  Diagnoses Date Noted  . Invasive ductal carcinoma of breast, stage 2 08/27/2011     No diagnosis found.Locally advanced, 3.5 cm, node positive, Stage IIB, ER positive PR positive HER-2/neu positive infiltrating ductal carcinoma, left breast diagnosed October 2011.   PRIOR THERAPY:  1. 4 cycles of dose dense Adriamycin Cytoxan followed by Taxol Herceptin, then Abraxane and Herceptin due to intolerable side effects from the Taxol specifically neuropathy. She completed a 12 week course of therapy.  2. Left lumpectomy with sentinel node dissection revealing T0 N0 disease, complete pathologic response.  3. Radiation therapy with concomitant Herceptin.    CURRENT THERAPY:  1. Maintenance Herceptin every 3 weeks, today will be last treatment.  2. Concurrent tamoxifen 20 mg daily since completion of radiation.with Herceptin given every 3 weeks. Dosing has been sporadic, at her discretion.   INTERIM HISTORY:  Upon entering the room, pt refuses to acknowledge my presence, looking at a magazine with friend. I wait until she engages, and we proceed with examination.   No problems since seen last, in fact is feeling much improved since discontinuing Tamoxifen. This was discontinued on a previous visit with Dr. Welton Flakes, due to significant arthralgias/myalgias. Pt states "I could either walk or take a chance on my breast cancer coming back. I'll take the chance of my breast coming back because I cannot take Tamoxifen". I explain the high risk nature of her breast cancer, but she is adamant about not taking Tamoxifen. I offer that often after a drug holiday, with reinitiation, the symptoms either do not recur, or are less severe. Again she strongly refuses to take Tamoxifen under any  circumstances.   PHYSICAL EXAM: BP 116/76  Pulse 66  Temp(Src) 97.3 F (36.3 C) (Oral)  Ht 5\' 4"  (1.626 m)  Wt 142 lb 1.6 oz (64.456 kg)  BMI 24.39 kg/m2 General: Well developed, well nourished, in no acute distress.  EENT: No ocular or oral lesions. No stomatitis.  Respiratory: Lungs are clear to auscultation bilaterally with normal respiratory movement and no accessory muscle use. Cardiac: No murmur, rub or tachycardia. No upper or lower extremity edema.  GI: Abdomen is soft, no palpable hepatosplenomegaly. No fluid wave. No tenderness. Musculoskeletal: No kyphosis, no tenderness over the spine, ribs or hips. Lymph: No cervical, infraclavicular, axillary or inguinal adenopathy. Neuro: No focal neurological deficits. Psych: Alert and oriented X 3, appropriate mood and affect.    LABORATORY STUDIES:   Results for orders placed in visit on 01/07/12  CBC WITH DIFFERENTIAL      Component Value Range   WBC 2.9 (*) 3.9 - 10.3 (10e3/uL)   NEUT# 1.8  1.5 - 6.5 (10e3/uL)   HGB 13.1  11.6 - 15.9 (g/dL)   HCT 13.0  86.5 - 78.4 (%)   Platelets 91 (*) 145 - 400 (10e3/uL)   MCV 84.9  79.5 - 101.0 (fL)   MCH 29.6  25.1 - 34.0 (pg)   MCHC 34.8  31.5 - 36.0 (g/dL)   RBC 6.96  2.95 - 2.84 (10e6/uL)   RDW 13.5  11.2 - 14.5 (%)   lymph# 0.9  0.9 -  3.3 (10e3/uL)   MONO# 0.2  0.1 - 0.9 (10e3/uL)   Eosinophils Absolute 0.0  0.0 - 0.5 (10e3/uL)   Basophils Absolute 0.0  0.0 - 0.1 (10e3/uL)   NEUT% 61.3  38.4 - 76.8 (%)   LYMPH% 30.1  14.0 - 49.7 (%)   MONO% 7.3  0.0 - 14.0 (%)   EOS% 1.0  0.0 - 7.0 (%)   BASO% 0.3  0.0 - 2.0 (%)   nRBC 0  0 - 0 (%)  BASIC METABOLIC PANEL      Component Value Range   Sodium 142  135 - 145 (mEq/L)   Potassium 3.7  3.5 - 5.3 (mEq/L)   Chloride 108  96 - 112 (mEq/L)   CO2 23  19 - 32 (mEq/L)   Glucose, Bld 86  70 - 99 (mg/dL)   BUN 14  6 - 23 (mg/dL)   Creatinine, Ser 1.61  0.50 - 1.10 (mg/dL)   Calcium 9.2  8.4 - 09.6 (mg/dL)    IMPRESSION:  51 y/o  black female with:  1. History of Stage IIB left breast cancer, post neoadjuvant chemo, lumpectomy, now on maintenance Herceptin.  2. Poor tolerance of Tamoxifen with significant arthralgias/myalgias. Has discontinued and declines opportunity to reinitiate.   PLAN:   1. Appointment with Dr. Welton Flakes in 6 months (per her request) to establish surveillance.  2. Treatment today, this will be final Herceptin treatment.   3. Remain off Tamoxifen at her insistence.    DISCUSSION: I review NCCN guidelines for surveillance and recommend every three months, in light of the fact she refuses Tamoxifen. She prefers every 6 months, adamant that Dr. Welton Flakes told her every 6 months. We will comply with her wishes.

## 2012-01-08 NOTE — Telephone Encounter (Signed)
made patient appointment to come in and see dr.cornet for the port a cath to be removed

## 2012-01-08 NOTE — Progress Notes (Signed)
Scheduling has been notified that pt needs to see DR Cornett asap to remove PAC

## 2012-01-08 NOTE — Telephone Encounter (Signed)
made patient appointment to see dr.cornett for port a cath removal on 02-04-2012 at 10:40am 

## 2012-01-21 ENCOUNTER — Ambulatory Visit: Payer: BC Managed Care – PPO | Admitting: Radiation Oncology

## 2012-02-04 ENCOUNTER — Ambulatory Visit (INDEPENDENT_AMBULATORY_CARE_PROVIDER_SITE_OTHER): Payer: BC Managed Care – PPO | Admitting: Surgery

## 2012-02-25 ENCOUNTER — Encounter (HOSPITAL_COMMUNITY): Payer: Self-pay

## 2012-02-25 ENCOUNTER — Ambulatory Visit (HOSPITAL_COMMUNITY)
Admission: RE | Admit: 2012-02-25 | Discharge: 2012-02-25 | Disposition: A | Discharge status: Home / Self Care | Payer: BC Managed Care – PPO | Source: Ambulatory Visit | Attending: Adult Health | Admitting: Adult Health

## 2012-02-25 ENCOUNTER — Ambulatory Visit (HOSPITAL_COMMUNITY)
Admission: RE | Admit: 2012-02-25 | Discharge: 2012-02-25 | Disposition: A | Discharge status: Home / Self Care | Payer: BC Managed Care – PPO | Source: Ambulatory Visit | Attending: Internal Medicine | Admitting: Internal Medicine

## 2012-02-25 VITALS — BP 105/60 | HR 77 | Ht 64.0 in | Wt 140.8 lb

## 2012-02-25 DIAGNOSIS — C50919 Malignant neoplasm of unspecified site of unspecified female breast: Secondary | ICD-10-CM | POA: Insufficient documentation

## 2012-02-25 DIAGNOSIS — I059 Rheumatic mitral valve disease, unspecified: Secondary | ICD-10-CM

## 2012-02-25 DIAGNOSIS — Z9010 Acquired absence of unspecified breast and nipple: Secondary | ICD-10-CM | POA: Insufficient documentation

## 2012-02-25 DIAGNOSIS — Z9221 Personal history of antineoplastic chemotherapy: Secondary | ICD-10-CM | POA: Insufficient documentation

## 2012-02-25 NOTE — Patient Instructions (Addendum)
Congrats on finishing your therapy.  Follow up with Dr. Gala Romney as needed.

## 2012-02-25 NOTE — Assessment & Plan Note (Signed)
Patient's preliminary echo reviewed with Dr. Gala Romney showed preserved EF of 55%.  At this time she is being discharged from the cardio-onc HF clinic since she has finished her herceptin therapy.  She is to follow up as needed or if there is a need to restart therapy.

## 2012-02-25 NOTE — Progress Notes (Signed)
  Echocardiogram 2D Echocardiogram has been performed.  [name] 02/25/2012, 2:48 PM

## 2012-02-25 NOTE — Progress Notes (Signed)
HPI:  [name] is a 51 yo female with locally advanced, stage IIB, ER/PR-positive, HER2 positive invasive ductal carcinoma of the left breast. She received neoadjuvant chemotherapy with 4 cycles of Adriamycin/Cytoxan, followed by Tal/Herceptin then switched to Abraxane/Herceptin. Status post left breast needle localized partial mastectomy with sentinel node dissection revealing pathologic staging T0N0. She completed radiation and is now on q3 week maintenance Herceptin until March 2013 and tamoxifen 20 mg daily for five years.   Echos:  1. December 2011: EF 60-65% with lateral S' velocity of not available to calculate  2. June 2012: EF 55-60% with lateral S' velocity  9.4  3. November 2012: EF appox 55% with lateral S' velocity  9.5 (full report to follow)  4. November 19, 2011 EF 55% lateral S' velocity 11.3 5. Preliminary review of echo: Feb 24, 2012 EF 55%  She returns for 3 month follow up today.  She is here for her last follow up appointment today after finishing her herceptin therapy in March.  She feels great.  No edema.  No chest pain.  No orthopnea/PND or syncope.     ROS: All systems negative except as listed in HPI, PMH and Problem List.  Past Medical History  Diagnosis Date  . Breast cancer Oct 2011  . Peripheral neuropathy     Current Outpatient Prescriptions  Medication Sig Dispense Refill  . Multiple Vitamin (MULTIVITAMIN) capsule Take 1 capsule by mouth daily.        . tamoxifen (NOLVADEX) 20 MG tablet Take 20 mg by mouth daily.           PHYSICAL EXAM: Filed Vitals:   02/25/12 1411  BP: 105/60  Pulse: 77  Height: 5\' 4"  (1.626 m)  Weight: 140 lb 12.8 oz (63.866 kg)  SpO2: 100%   General:  Well appearing. No resp difficulty HEENT: normal Neck: supple. JVP flat. Carotids 2+ bilaterally; no bruits. No lymphadenopathy or thryomegaly appreciated. Cor: PMI normal. Regular rate & rhythm. No rubs, gallops or murmurs. Lungs: clear Abdomen: soft, nontender,  nondistended. No hepatosplenomegaly. No bruits or masses. Good bowel sounds. Extremities: no cyanosis, clubbing, rash, edema Neuro: alert & orientedx3, cranial nerves grossly intact. Moves all 4 extremities w/o difficulty. Affect pleasant.   ASSESSMENT & PLAN:

## 2012-03-06 ENCOUNTER — Encounter (INDEPENDENT_AMBULATORY_CARE_PROVIDER_SITE_OTHER): Payer: Self-pay | Admitting: Surgery

## 2012-03-07 ENCOUNTER — Encounter (INDEPENDENT_AMBULATORY_CARE_PROVIDER_SITE_OTHER): Payer: Self-pay | Admitting: Surgery

## 2012-03-07 ENCOUNTER — Other Ambulatory Visit (INDEPENDENT_AMBULATORY_CARE_PROVIDER_SITE_OTHER): Payer: Self-pay | Admitting: Surgery

## 2012-03-07 ENCOUNTER — Encounter (HOSPITAL_COMMUNITY): Payer: Self-pay | Admitting: Pharmacy Technician

## 2012-03-07 ENCOUNTER — Ambulatory Visit (INDEPENDENT_AMBULATORY_CARE_PROVIDER_SITE_OTHER): Payer: BC Managed Care – PPO | Admitting: Surgery

## 2012-03-07 VITALS — BP 116/68 | HR 68 | Temp 97.4°F | Resp 12 | Ht 64.0 in | Wt 141.8 lb

## 2012-03-07 DIAGNOSIS — Z853 Personal history of malignant neoplasm of breast: Secondary | ICD-10-CM

## 2012-03-07 NOTE — Progress Notes (Signed)
Patient ID: [name], female   DOB: 04/01/1961, 50 y.o.   MRN: [id number]  Chief Complaint  Patient presents with  . Follow-up    reck PAC and discuss removal    HPI Calleen D Hoover is a 50 y.o. female.   HPIPatient returns to clinic. She has a history of stage II breast cancer treated with neoadjuvant chemotherapy subsequent lumpectomy and sentinel lymph node mapping and completion chemotherapy with Herceptin and radiation therapy. This was in May of 2012. She has completed Herceptin. She is doing well.  Past Medical History  Diagnosis Date  . Peripheral neuropathy   . Breast cancer Oct 2011    left    Past Surgical History  Procedure Date  . Breast lumpectomy   . Portacath placement     Family History  Problem Relation Age of Onset  . Cancer Paternal Grandmother     breast    Social History History  Substance Use Topics  . Smoking status: Never Smoker   . Smokeless tobacco: Not on file  . Alcohol Use: No    No Known Allergies  Current Outpatient Prescriptions  Medication Sig Dispense Refill  . Multiple Vitamin (MULTIVITAMIN) capsule Take 1 capsule by mouth daily.        . tamoxifen (NOLVADEX) 20 MG tablet Take 20 mg by mouth daily.          Review of Systems Review of Systems  Constitutional: Negative for fever, chills and unexpected weight change.  HENT: Negative for hearing loss, congestion, sore throat, trouble swallowing and voice change.   Eyes: Negative for visual disturbance.  Respiratory: Negative for cough and wheezing.   Cardiovascular: Negative for chest pain, palpitations and leg swelling.  Gastrointestinal: Negative for nausea, vomiting, abdominal pain, diarrhea, constipation, blood in stool, abdominal distention and anal bleeding.  Genitourinary: Negative for hematuria, vaginal bleeding and difficulty urinating.  Musculoskeletal: Negative for arthralgias.  Skin: Negative for rash and wound.  Neurological: Negative for seizures, syncope and  headaches.  Hematological: Negative for adenopathy. Does not bruise/bleed easily.  Psychiatric/Behavioral: Negative for confusion.    Blood pressure 116/68, pulse 68, temperature 97.4 F (36.3 C), temperature source Temporal, resp. rate 12, height 5' 4" (1.626 m), weight 141 lb 12.8 oz (64.32 kg).  Physical Exam Physical Exam  Constitutional: She is oriented to person, place, and time. She appears well-developed and well-nourished.  HENT:  Head: Normocephalic and atraumatic.  Eyes: EOM are normal. Pupils are equal, round, and reactive to light.  Neck: Normal range of motion. Neck supple.  Pulmonary/Chest:       Port   Right chest wall  Musculoskeletal: Normal range of motion.  Neurological: She is alert and oriented to person, place, and time.  Skin: Skin is warm and dry.  Psychiatric: She has a normal mood and affect. Her behavior is normal. Judgment and thought content normal.    Data Reviewed Dr Khan's notes  Assessment    History of breast cancer and port a cath    Plan    She is done with chemotherapy and wants her port out.  Will schedule port explantation.The procedure has been discussed with the patient.  Alternative therapies have been discussed with the patient.  Operative risks include bleeding,  Infection,  Organ injury,  Nerve injury,  Blood vessel injury,  DVT,  Pulmonary embolism,  Death,  And possible reoperation.  Medical management risks include worsening of present situation.  The success of the procedure is 50 -100% at   treating patients symptoms.  The patient understands and agrees to proceed.       [name] A. 03/07/2012, 12:27 PM    

## 2012-03-07 NOTE — Patient Instructions (Signed)
Will schedule you for port removal.

## 2012-03-10 ENCOUNTER — Ambulatory Visit (HOSPITAL_COMMUNITY): Payer: BC Managed Care – PPO | Admitting: Anesthesiology

## 2012-03-10 ENCOUNTER — Ambulatory Visit (HOSPITAL_COMMUNITY)
Admission: RE | Admit: 2012-03-10 | Discharge: 2012-03-10 | Disposition: A | Discharge status: Home / Self Care | Payer: BC Managed Care – PPO | Source: Ambulatory Visit | Attending: Surgery | Admitting: Surgery

## 2012-03-10 ENCOUNTER — Encounter (HOSPITAL_COMMUNITY)
Admission: RE | Disposition: A | Discharge status: Home / Self Care | Payer: Self-pay | Source: Ambulatory Visit | Attending: Surgery

## 2012-03-10 ENCOUNTER — Encounter (HOSPITAL_COMMUNITY): Payer: Self-pay | Admitting: Anesthesiology

## 2012-03-10 ENCOUNTER — Encounter (HOSPITAL_COMMUNITY): Payer: Self-pay

## 2012-03-10 DIAGNOSIS — G609 Hereditary and idiopathic neuropathy, unspecified: Secondary | ICD-10-CM | POA: Insufficient documentation

## 2012-03-10 DIAGNOSIS — C50919 Malignant neoplasm of unspecified site of unspecified female breast: Secondary | ICD-10-CM | POA: Insufficient documentation

## 2012-03-10 DIAGNOSIS — Z452 Encounter for adjustment and management of vascular access device: Secondary | ICD-10-CM

## 2012-03-10 DIAGNOSIS — Z79899 Other long term (current) drug therapy: Secondary | ICD-10-CM | POA: Insufficient documentation

## 2012-03-10 DIAGNOSIS — Z853 Personal history of malignant neoplasm of breast: Secondary | ICD-10-CM

## 2012-03-10 HISTORY — PX: PORT-A-CATH REMOVAL: SHX5289

## 2012-03-10 LAB — SURGICAL PCR SCREEN: Staphylococcus aureus: NEGATIVE

## 2012-03-10 LAB — BASIC METABOLIC PANEL
Calcium: 9.4 mg/dL (ref 8.4–10.5)
Creatinine, Ser: 0.62 mg/dL (ref 0.50–1.10)
GFR calc non Af Amer: >90 mL/min (ref >90)
Glucose, Bld: 111 mg/dL — ABNORMAL HIGH (ref 70–99)
Sodium: 142 mEq/L (ref 135–145)

## 2012-03-10 SURGERY — REMOVAL PORT-A-CATH
Anesthesia: Monitor Anesthesia Care | Site: Chest | Wound class: Clean

## 2012-03-10 MED ORDER — ACETAMINOPHEN 10 MG/ML IV SOLN
INTRAVENOUS | Status: AC
  Filled 2012-03-10: qty 100

## 2012-03-10 MED ORDER — LIDOCAINE-EPINEPHRINE 1 %-1:100000 IJ SOLN
INTRAMUSCULAR | Status: AC
  Filled 2012-03-10: qty 1

## 2012-03-10 MED ORDER — CEFAZOLIN SODIUM 1-5 GM-% IV SOLN: 1.0000 g | INTRAVENOUS | Status: DC

## 2012-03-10 MED ORDER — LACTATED RINGERS IV SOLN
INTRAVENOUS | Status: DC | PRN
  Administered 2012-03-10: 09:00:00 via INTRAVENOUS

## 2012-03-10 MED ORDER — CEFAZOLIN SODIUM 1-5 GM-% IV SOLN
INTRAVENOUS | Status: AC
  Filled 2012-03-10: qty 50

## 2012-03-10 MED ORDER — MIDAZOLAM HCL 5 MG/5ML IJ SOLN
INTRAMUSCULAR | Status: DC | PRN
  Administered 2012-03-10: 2 mg via INTRAVENOUS

## 2012-03-10 MED ORDER — ACETAMINOPHEN 10 MG/ML IV SOLN
INTRAVENOUS | Status: DC | PRN
  Administered 2012-03-10: 1000 mg via INTRAVENOUS

## 2012-03-10 MED ORDER — PROPOFOL 10 MG/ML IV EMUL
INTRAVENOUS | Status: DC | PRN
  Administered 2012-03-10: 75 ug/kg/min via INTRAVENOUS

## 2012-03-10 MED ORDER — HYDROMORPHONE HCL PF 1 MG/ML IJ SOLN: 0.2500 mg | INTRAMUSCULAR | Status: DC | PRN

## 2012-03-10 MED ORDER — BUPIVACAINE-EPINEPHRINE PF 0.25-1:200000 % IJ SOLN
INTRAMUSCULAR | Status: AC
  Filled 2012-03-10: qty 30

## 2012-03-10 MED ORDER — MUPIROCIN 2 % EX OINT
TOPICAL_OINTMENT | CUTANEOUS | Status: AC
  Filled 2012-03-10: qty 22

## 2012-03-10 MED ORDER — LIDOCAINE HCL (CARDIAC) 20 MG/ML IV SOLN
INTRAVENOUS | Status: DC | PRN
  Administered 2012-03-10: 70 mg via INTRAVENOUS

## 2012-03-10 MED ORDER — BUPIVACAINE-EPINEPHRINE 0.25% -1:200000 IJ SOLN
INTRAMUSCULAR | Status: DC | PRN
  Administered 2012-03-10: 10 mL

## 2012-03-10 MED ORDER — HYDROCODONE-ACETAMINOPHEN 5-325 MG PO TABS: 1.0000 | ORAL_TABLET | Freq: Four times a day (QID) | ORAL | Status: AC | PRN

## 2012-03-10 MED ORDER — ONDANSETRON HCL 4 MG/2ML IJ SOLN
INTRAMUSCULAR | Status: DC | PRN
  Administered 2012-03-10: 4 mg via INTRAVENOUS

## 2012-03-10 MED ORDER — FENTANYL CITRATE 0.05 MG/ML IJ SOLN
INTRAMUSCULAR | Status: DC | PRN
  Administered 2012-03-10 (×2): 50 ug via INTRAVENOUS

## 2012-03-10 MED ORDER — CHLORHEXIDINE GLUCONATE 4 % EX LIQD
1.0000 "application " | Freq: Once | CUTANEOUS | Status: DC
  Filled 2012-03-10: qty 15

## 2012-03-10 SURGICAL SUPPLY — 32 items
BLADE HEX COATED 2.75 (ELECTRODE) ×2 IMPLANT
BLADE SURG 15 STRL LF DISP TIS (BLADE) ×2 IMPLANT
BLADE SURG 15 STRL SS (BLADE) ×2
BLADE SURG SZ10 CARB STEEL (BLADE) ×2 IMPLANT
CANISTER SUCTION 2500CC (MISCELLANEOUS) ×2 IMPLANT
CLOTH BEACON ORANGE TIMEOUT ST (SAFETY) ×2 IMPLANT
DECANTER SPIKE VIAL GLASS SM (MISCELLANEOUS) ×2 IMPLANT
DERMABOND ADVANCED (GAUZE/BANDAGES/DRESSINGS) ×1
DERMABOND ADVANCED .7 DNX12 (GAUZE/BANDAGES/DRESSINGS) ×1 IMPLANT
DRAPE LAPAROTOMY TRNSV 102X78 (DRAPE) ×2 IMPLANT
ELECT REM PT RETURN 9FT ADLT (ELECTROSURGICAL) ×2
ELECTRODE REM PT RTRN 9FT ADLT (ELECTROSURGICAL) ×1 IMPLANT
GAUZE SPONGE 4X4 16PLY XRAY LF (GAUZE/BANDAGES/DRESSINGS) ×2 IMPLANT
GLOVE BIOGEL PI IND STRL 7.0 (GLOVE) ×1 IMPLANT
GLOVE BIOGEL PI INDICATOR 7.0 (GLOVE) ×1
GLOVE INDICATOR 8.0 STRL GRN (GLOVE) ×4 IMPLANT
GLOVE SS BIOGEL STRL SZ 8 (GLOVE) ×1 IMPLANT
GLOVE SUPERSENSE BIOGEL SZ 8 (GLOVE) ×1
GOWN STRL NON-REIN LRG LVL3 (GOWN DISPOSABLE) ×2 IMPLANT
GOWN STRL REIN XL XLG (GOWN DISPOSABLE) ×4 IMPLANT
KIT BASIN OR (CUSTOM PROCEDURE TRAY) ×2 IMPLANT
NEEDLE HYPO 22GX1.5 SAFETY (NEEDLE) ×2 IMPLANT
NEEDLE HYPO 25X1 1.5 SAFETY (NEEDLE) ×2 IMPLANT
NS IRRIG 1000ML POUR BTL (IV SOLUTION) ×2 IMPLANT
PACK BASIC VI WITH GOWN DISP (CUSTOM PROCEDURE TRAY) ×2 IMPLANT
PEN SKIN MARKING BROAD (MISCELLANEOUS) ×2 IMPLANT
PENCIL BUTTON HOLSTER BLD 10FT (ELECTRODE) ×2 IMPLANT
SOL PREP POV-IOD 16OZ 10% (MISCELLANEOUS) ×2 IMPLANT
SPONGE GAUZE 4X4 12PLY (GAUZE/BANDAGES/DRESSINGS) ×2 IMPLANT
SYR CONTROL 10ML LL (SYRINGE) ×2 IMPLANT
TOWEL OR 17X26 10 PK STRL BLUE (TOWEL DISPOSABLE) ×2 IMPLANT
YANKAUER SUCT BULB TIP 10FT TU (MISCELLANEOUS) ×2 IMPLANT

## 2012-03-10 NOTE — Discharge Instructions (Signed)
Resume full activity as tolerated Tuesday.  Ice pack as needed.  OK to shower.  Glue will fall off in 2 weeks. No physical restrictions.  Resume medications and diet.

## 2012-03-10 NOTE — Interval H&P Note (Signed)
History and Physical Interval Note:  03/10/2012 8:45 AM  [name]  has presented today for surgery, with the diagnosis of un-needed port  The various methods of treatment have been discussed with the patient and family. After consideration of risks, benefits and other options for treatment, the patient has consented to  Procedure(s) (LRB): REMOVAL PORT-A-CATH (N/A) as a surgical intervention .  The patients' history has been reviewed, patient examined, no change in status, stable for surgery.  I have reviewed the patients' chart and labs.  Questions were answered to the patient's satisfaction.     Dulcy Sida A.

## 2012-03-10 NOTE — H&P (View-Only) (Signed)
Patient ID: [name], female   DOB: Mar 17, 1961, 51 y.o.   MRN: [id number]  Chief Complaint  Patient presents with  . Follow-up    reck PAC and discuss removal    HPI [name] is a 51 y.o. female.   HPIPatient returns to clinic. She has a history of stage II breast cancer treated with neoadjuvant chemotherapy subsequent lumpectomy and sentinel lymph node mapping and completion chemotherapy with Herceptin and radiation therapy. This was in May of 2012. She has completed Herceptin. She is doing well.  Past Medical History  Diagnosis Date  . Peripheral neuropathy   . Breast cancer Oct 2011    left    Past Surgical History  Procedure Date  . Breast lumpectomy   . Portacath placement     Family History  Problem Relation Age of Onset  . Cancer Paternal Grandmother     breast    Social History History  Substance Use Topics  . Smoking status: Never Smoker   . Smokeless tobacco: Not on file  . Alcohol Use: No    No Known Allergies  Current Outpatient Prescriptions  Medication Sig Dispense Refill  . Multiple Vitamin (MULTIVITAMIN) capsule Take 1 capsule by mouth daily.        . tamoxifen (NOLVADEX) 20 MG tablet Take 20 mg by mouth daily.          Review of Systems Review of Systems  Constitutional: Negative for fever, chills and unexpected weight change.  HENT: Negative for hearing loss, congestion, sore throat, trouble swallowing and voice change.   Eyes: Negative for visual disturbance.  Respiratory: Negative for cough and wheezing.   Cardiovascular: Negative for chest pain, palpitations and leg swelling.  Gastrointestinal: Negative for nausea, vomiting, abdominal pain, diarrhea, constipation, blood in stool, abdominal distention and anal bleeding.  Genitourinary: Negative for hematuria, vaginal bleeding and difficulty urinating.  Musculoskeletal: Negative for arthralgias.  Skin: Negative for rash and wound.  Neurological: Negative for seizures, syncope and  headaches.  Hematological: Negative for adenopathy. Does not bruise/bleed easily.  Psychiatric/Behavioral: Negative for confusion.    Blood pressure 116/68, pulse 68, temperature 97.4 F (36.3 C), temperature source Temporal, resp. rate 12, height 5\' 4"  (1.626 m), weight 141 lb 12.8 oz (64.32 kg).  Physical Exam Physical Exam  Constitutional: She is oriented to person, place, and time. She appears well-developed and well-nourished.  HENT:  Head: Normocephalic and atraumatic.  Eyes: EOM are normal. Pupils are equal, round, and reactive to light.  Neck: Normal range of motion. Neck supple.  Pulmonary/Chest:       Port   Right chest wall  Musculoskeletal: Normal range of motion.  Neurological: She is alert and oriented to person, place, and time.  Skin: Skin is warm and dry.  Psychiatric: She has a normal mood and affect. Her behavior is normal. Judgment and thought content normal.    Data Reviewed Dr Milta Deiters notes  Assessment    History of breast cancer and port a cath    Plan    She is done with chemotherapy and wants her port out.  Will schedule port explantation.The procedure has been discussed with the patient.  Alternative therapies have been discussed with the patient.  Operative risks include bleeding,  Infection,  Organ injury,  Nerve injury,  Blood vessel injury,  DVT,  Pulmonary embolism,  Death,  And possible reoperation.  Medical management risks include worsening of present situation.  The success of the procedure is 50 -100% at  treating patients symptoms.  The patient understands and agrees to proceed.       Adir Schicker A. 03/07/2012, 12:27 PM

## 2012-03-10 NOTE — Progress Notes (Signed)
PCR screen negative for 03-10-2012 patient made aware

## 2012-03-10 NOTE — Anesthesia Postprocedure Evaluation (Signed)
  Anesthesia Post-op Note  Patient: [name]  Procedure(s) Performed: Procedure(s) (LRB): REMOVAL PORT-A-CATH (N/A)  Patient Location: PACU  Anesthesia Type: MAC  Level of Consciousness: oriented and sedated  Airway and Oxygen Therapy: Patient Spontanous Breathing  Post-op Pain: mild  Post-op Assessment: Post-op Vital signs reviewed, Patient's Cardiovascular Status Stable, Respiratory Function Stable and Patent Airway  Post-op Vital Signs: stable  Complications: No apparent anesthesia complications

## 2012-03-10 NOTE — Transfer of Care (Signed)
Immediate Anesthesia Transfer of Care Note  Patient: [name]  Procedure(s) Performed: Procedure(s) (LRB): REMOVAL PORT-A-CATH (N/A)  Patient Location: PACU  Anesthesia Type: MAC  Level of Consciousness: sedated, patient cooperative and responds to stimulaton  Airway & Oxygen Therapy: Patient Spontanous Breathing and Patient connected to face mask oxgen  Post-op Assessment: Report given to PACU RN and Post -op Vital signs reviewed and stable  Post vital signs: Reviewed and stable  Complications: No apparent anesthesia complications

## 2012-03-10 NOTE — Anesthesia Preprocedure Evaluation (Signed)
Anesthesia Evaluation  Patient identified by MRN, date of birth, ID band Patient awake    Reviewed: Allergy & Precautions, H&P , NPO status , Patient's Chart, lab work & pertinent test results, reviewed documented beta blocker date and time   Airway Mallampati: II TM Distance: >3 FB Neck ROM: Full    Dental  (+) Teeth Intact and Dental Advisory Given   Pulmonary neg pulmonary ROS,  breath sounds clear to auscultation        Cardiovascular negative cardio ROS  Rhythm:Regular Rate:Normal  Denies cardiac symptoms   Neuro/Psych negative neurological ROS  negative psych ROS   GI/Hepatic negative GI ROS, Neg liver ROS,   Endo/Other  negative endocrine ROS  Renal/GU negative Renal ROS  negative genitourinary   Musculoskeletal negative musculoskeletal ROS (+)   Abdominal   Peds negative pediatric ROS (+)  Hematology negative hematology ROS (+)   Anesthesia Other Findings   Reproductive/Obstetrics Hx breast cancer                           Anesthesia Physical Anesthesia Plan  ASA: II  Anesthesia Plan: MAC   Post-op Pain Management:    Induction: Intravenous  Airway Management Planned: Mask  Additional Equipment:   Intra-op Plan:   Post-operative Plan:   Informed Consent:   Dental advisory given  Plan Discussed with: CRNA and Surgeon  Anesthesia Plan Comments:         Anesthesia Quick Evaluation

## 2012-03-10 NOTE — Op Note (Signed)
Preop diagnosis: Indwelling port a catheter for chemotherapy  Postop diagnosis: Same  Procedure: Removal of port a catheter  Surgeon: Harriette Bouillon M.D.  Anesthesia: MAC with local 0.25% bupivicaine with epinephrine  EBL: Minimal  Specimen none  Drains: None  Indications for procedure: The patient presents for removal of port a catheter after completing chemotherapy. The patient no longer requires central venous access. Risks of bleeding, infection, catheter fragmentation, embolization, arrhythmias and damage to arteries, veins and nerves and possibly other mediastinal structures discussed. The patient agrees to proceed.  Description of procedure: The patient was seen in the holding area. Questions were answered. The patient agreed to proceed. The patient was taken to the operating room. The patient was placed supine. Anesthesia was initiated. The skin on the upper chest was prepped and draped in a sterile fashion. Timeout was done. The patient received preoperative antibiotics. Incision was made through the old port site   On the right and the hub of the Port-A-Cath was seen. The sutures were cut to release the port from the chest wall. The catheter was removed in its entirety without difficulty. The tract was closed with 3-0 Vicryl. 4 Monocryl was used to close the skin. All final counts were correct. The patient was taken to recovery in satisfactory condition.

## 2012-03-12 ENCOUNTER — Encounter (HOSPITAL_COMMUNITY): Payer: Self-pay | Admitting: Surgery

## 2012-07-07 ENCOUNTER — Telehealth: Payer: Self-pay | Admitting: *Deleted

## 2012-07-07 ENCOUNTER — Other Ambulatory Visit: Payer: Self-pay | Admitting: *Deleted

## 2012-07-07 ENCOUNTER — Other Ambulatory Visit: Payer: Self-pay | Admitting: Oncology

## 2012-07-07 ENCOUNTER — Encounter: Payer: Self-pay | Admitting: Oncology

## 2012-07-07 ENCOUNTER — Other Ambulatory Visit (HOSPITAL_BASED_OUTPATIENT_CLINIC_OR_DEPARTMENT_OTHER): Payer: BC Managed Care – PPO | Admitting: Lab

## 2012-07-07 ENCOUNTER — Ambulatory Visit (HOSPITAL_BASED_OUTPATIENT_CLINIC_OR_DEPARTMENT_OTHER): Payer: BC Managed Care – PPO | Admitting: Oncology

## 2012-07-07 VITALS — BP 115/75 | HR 79 | Temp 98.4°F | Resp 20 | Ht 64.0 in | Wt 146.9 lb

## 2012-07-07 DIAGNOSIS — C50919 Malignant neoplasm of unspecified site of unspecified female breast: Secondary | ICD-10-CM

## 2012-07-07 DIAGNOSIS — Z170 Estrogen receptor positive status [ER+]: Secondary | ICD-10-CM

## 2012-07-07 DIAGNOSIS — C50419 Malignant neoplasm of upper-outer quadrant of unspecified female breast: Secondary | ICD-10-CM

## 2012-07-07 LAB — CBC WITH DIFFERENTIAL/PLATELET
BASO%: 0.3 % (ref 0.0–2.0)
EOS%: 0.8 % (ref 0.0–7.0)
Eosinophils Absolute: 0 10*3/uL (ref 0.0–0.5)
LYMPH%: 30.4 % (ref 14.0–49.7)
MCH: 29.8 pg (ref 25.1–34.0)
MCHC: 34.5 g/dL (ref 31.5–36.0)
MCV: 86.3 fL (ref 79.5–101.0)
MONO%: 7.3 % (ref 0.0–14.0)
NEUT#: 2.3 10*3/uL (ref 1.5–6.5)
Platelets: 97 10*3/uL — ABNORMAL LOW (ref 145–400)
RBC: 4.83 10*6/uL (ref 3.70–5.45)

## 2012-07-07 LAB — COMPREHENSIVE METABOLIC PANEL (CC13)
ALT: 12 U/L (ref 0–55)
AST: 20 U/L (ref 5–34)
Albumin: 4 g/dL (ref 3.5–5.0)
BUN: 15 mg/dL (ref 7.0–26.0)
CO2: 27 mEq/L (ref 22–29)
Calcium: 9.7 mg/dL (ref 8.4–10.4)
Chloride: 107 mEq/L (ref 98–107)
Creatinine: 0.8 mg/dL (ref 0.6–1.1)
Potassium: 4.2 mEq/L (ref 3.5–5.1)

## 2012-07-07 NOTE — Progress Notes (Signed)
OFFICE PROGRESS NOTE Dr. 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 She was initially treated with neoadjuvant chemotherapy for a 3.5 cm node positive stage IIB disease.  PRIOR THERAPY:  #1 status post 4 cycles of dose dense Adriamycin Cytoxan followed by Taxol Herceptin then Abraxane and Herceptin do to intolerable side effects from the Taxol specifically neuropathy. She completed a 12 week course of therapy.  #2 status post left lumpectomy with sentinel node dissection revealing T0 N0 disease. Patient had a complete pathologic response.  #3 vision then went on to have radiation therapy with concomitant Herceptin.  #4 patient is now being maintained on Herceptin every 3 weeks. She is due to complete all of her therapy in March 2013.  #5 patient is receiving concurrent tamoxifen 20 mg daily since completion of her radiation.  CURRENT THERAPY: Tamoxifen 20 mg daily since March 2013..  INTERVAL HISTORY: [name] 51 y.o. female returns for followup visit without any new complaints. She continues to have problems with her legs especially the knee joints. I think this may just be do to overuse from her work as well as her exercise routine. No nausea or vomiting, no fevers or chills. No headaches, she has joint pains bilaterally in the knee. Remainder of the 10 point review of systems is negative.    MEDICAL HISTORY: Past Medical History  Diagnosis Date  . Peripheral neuropathy   . Breast cancer Oct 2011    left    ALLERGIES:   has no known allergies.  MEDICATIONS:  Current Outpatient Prescriptions  Medication Sig Dispense Refill  . Multiple Vitamin (MULTIVITAMIN) capsule Take 1 capsule by mouth daily.       . naproxen sodium (ANAPROX) 220 MG tablet Take 440 mg by mouth 2 (two) times daily as needed. For pain      . tamoxifen  (NOLVADEX) 20 MG tablet Take 20 mg by mouth daily with breakfast.         SURGICAL HISTORY:  Past Surgical History  Procedure Date  . Breast lumpectomy   . Portacath placement   . Port-a-cath removal 03/10/2012    Procedure: REMOVAL PORT-A-CATH;  Surgeon: Clovis Pu. Cornett, MD;  Location: [facility];  Service: General;  Laterality: N/A;    REVIEW OF SYSTEMS:  Pertinent items are noted in HPI.   PHYSICAL EXAMINATION: General appearance: alert, cooperative and appears stated age Head: Normocephalic, without obvious abnormality, atraumatic Neck: no adenopathy, no carotid bruit, no JVD, supple, symmetrical, trachea midline and thyroid not enlarged, symmetric, no tenderness/mass/nodules Lymph nodes: Cervical, supraclavicular, and axillary nodes normal. Resp: clear to auscultation bilaterally and normal percussion bilaterally Back: symmetric, no curvature. ROM normal. No CVA tenderness. Cardio: regular rate and rhythm, S1, S2 normal, no murmur, click, rub or gallop and normal apical impulse GI: soft, non-tender; bowel sounds normal; no masses,  no organomegaly Extremities: extremities normal, atraumatic, no cyanosis or edema Neurologic: Alert and oriented X 3, normal strength and tone. Normal symmetric reflexes. Normal coordination and gait  ECOG PERFORMANCE STATUS: 1 - Symptomatic but completely ambulatory  Blood pressure 115/75, pulse 79, temperature 98.4 F (36.9 C), temperature source Oral, resp. rate 20, height 5\' 4"  (1.626 m), weight 146 lb 14.4 oz (66.633 kg).  LABORATORY DATA: Lab Results  Component Value Date   WBC 3.8* 07/07/2012   HGB 14.4 07/07/2012   HCT 41.7 07/07/2012   MCV 86.3 07/07/2012  PLT 97* 07/07/2012      Chemistry      Component Value Date/Time   NA 142 03/10/2012 0735   K 4.4 03/10/2012 0735   CL 104 03/10/2012 0735   CO2 31 03/10/2012 0735   BUN 13 03/10/2012 0735   CREATININE 0.62 03/10/2012 0735      Component Value Date/Time   CALCIUM 9.4 03/10/2012 0735    ALKPHOS 60 11/05/2011 0921   AST 22 11/05/2011 0921   ALT 12 11/05/2011 0921   BILITOT 0.8 11/05/2011 0921       RADIOGRAPHIC STUDIES:  No results found.  ASSESSMENT: 51 year old female with:  1.  stage IIB ER/PR and her2Neu positive breast cancer. S/p neoadjuvant chemotherapy. She then underwent a lumpectomy with sentinel node biopsy. The final pathology showed a pathologic response.   2.Patient went on to complete radiation as well as one years worth of Herceptin. She was then started on tamoxifen 20 mg daily in March 2013. Thus far she is tolerating it well.  PLAN:   #1 patient will continue tamoxifen 20 mg daily.  #2 for her aches and pains I have recommended Glucosamine chondroitin sulfate.  #3 she will return in 6 months time for followup.    All questions were answered. The patient knows to call the clinic with any problems, questions or concerns. We can certainly see the patient much sooner if necessary.  I spent 25 minutes counseling the patient face to face. The total time spent in the appointment was 30 minutes.    Drue Second, MD Medical/Oncology [facility] [phone] (beeper) [phone] (Office)  07/07/2012, 9:50 AM

## 2012-07-07 NOTE — Telephone Encounter (Signed)
Gave patient appointment for 12-22-2012 starting at 9:00am 

## 2012-07-07 NOTE — Patient Instructions (Addendum)
1. Take vitamin D3 1000iu daily  2. Try glucosamine chondroitin daiily  3. Continue tamoxifen  4. I will see you back in 6 months

## 2012-07-08 ENCOUNTER — Other Ambulatory Visit: Payer: Self-pay | Admitting: *Deleted

## 2012-07-08 NOTE — Telephone Encounter (Signed)
3 calls regarding pt's Tamoxifen. Per pt Pharmacy does not have perscription. Called [employer] and left information for Tamoxifen 20mg  1 PO Daily q 30 r-3 . Called pt [facility] medication has been called in to Pharmacy

## 2012-07-09 ENCOUNTER — Telehealth: Payer: Self-pay | Admitting: *Deleted

## 2012-07-09 NOTE — Telephone Encounter (Signed)
Gave patient appointment for 12-22-2012 starting at 9:00am

## 2012-08-20 ENCOUNTER — Telehealth: Payer: Self-pay | Admitting: Oncology

## 2012-08-20 NOTE — Telephone Encounter (Signed)
l[facility] adviisng the pt of her r/s march appt from 12/22/2012 to 01/01/2013 due to a change in the md's schedule.

## 2012-10-07 ENCOUNTER — Telehealth: Payer: Self-pay | Admitting: *Deleted

## 2012-10-07 NOTE — Telephone Encounter (Signed)
Advisable for her to continue the tamoxifen.  Is she taking the neurontin?

## 2012-10-07 NOTE — Telephone Encounter (Signed)
Pt called [facility]  c/o " burning in my feet, I got new shoes and still burning. I don't know what's going on. I didn't have this until I started the tamoxifen. Can I stop the Tamoxifen?" Message forward to provider

## 2012-10-07 NOTE — Telephone Encounter (Signed)
Per MD, notified pt to continue Tamoxifen. Pt advised she is not taking neurontin and does not recall ever taking this medication. Discussed with pt I will notify MD and call her back with further instructions.

## 2012-10-13 ENCOUNTER — Other Ambulatory Visit: Payer: Self-pay | Admitting: *Deleted

## 2012-10-13 MED ORDER — GABAPENTIN 100 MG PO CAPS: 100.0000 mg | ORAL_CAPSULE | Freq: Three times a day (TID) | ORAL | Status: DC

## 2012-10-13 NOTE — Telephone Encounter (Signed)
Pt called [facility] c/o feet burning. Reviewed with NP, VO for Neurontin 100mg  TID. Rx sent to pt's pharmacy.Notified pt.

## 2012-10-17 ENCOUNTER — Telehealth: Payer: Self-pay | Admitting: *Deleted

## 2012-10-17 NOTE — Telephone Encounter (Signed)
Pt called advised she has read the SE for Neurontin and will not be taking this medication. Pt inquired about naproxen, if this was a medication she could take for pain. Discussed with pt she may take naproxen for pain and it is OTC if she runs out. Pt verbalized understanding, advised she has this at home. Pt instructed to call back with concerns. No further questions.

## 2012-10-20 ENCOUNTER — Telehealth: Payer: Self-pay | Admitting: Medical Oncology

## 2012-10-20 NOTE — Telephone Encounter (Signed)
Returned pt's call and per NP informed pt to eat a balanced diet and exercise for more than 30 minutes most days of the week. Patient states she already exercises doing "hard cardio," also stated she drinks plenty of fluids. Patient states she will call back next week to follow-up.

## 2012-10-20 NOTE — Telephone Encounter (Signed)
Pt called and [facility] to inform office that she will take the Gabepatin and will call office to inform MD if medication is working or not for feet. Pt also stated that she has been "feeling bloated and having weight gain despite working out" questioning whether this is a S/E of Tamoxifen. Will review with MD/NP.

## 2012-10-20 NOTE — Telephone Encounter (Signed)
This is a possible adverse effect of Tamoxifen.  She should eat a balanced diet and exercise for more than 30 minutes most days of the week.

## 2012-11-10 ENCOUNTER — Other Ambulatory Visit: Payer: Self-pay | Admitting: Oncology

## 2012-11-10 DIAGNOSIS — Z853 Personal history of malignant neoplasm of breast: Secondary | ICD-10-CM

## 2012-11-17 ENCOUNTER — Ambulatory Visit
Admission: RE | Admit: 2012-11-17 | Discharge: 2012-11-17 | Disposition: A | Discharge status: Home / Self Care | Payer: BC Managed Care – PPO | Source: Ambulatory Visit | Attending: Oncology | Admitting: Oncology

## 2012-11-17 DIAGNOSIS — Z853 Personal history of malignant neoplasm of breast: Secondary | ICD-10-CM

## 2012-12-16 ENCOUNTER — Telehealth: Payer: Self-pay | Admitting: Emergency Medicine

## 2012-12-16 NOTE — Telephone Encounter (Signed)
Patient called and left message stating that her feet are burning. Advised patient to return call for further instructions.

## 2012-12-17 ENCOUNTER — Other Ambulatory Visit: Payer: Self-pay | Admitting: Medical Oncology

## 2012-12-17 ENCOUNTER — Telehealth: Payer: Self-pay | Admitting: Oncology

## 2012-12-17 DIAGNOSIS — Z853 Personal history of malignant neoplasm of breast: Secondary | ICD-10-CM

## 2012-12-17 NOTE — Telephone Encounter (Signed)
S/w pt re appt for 3/3.

## 2012-12-17 NOTE — Telephone Encounter (Signed)
Patient has no follow up appointments. Apparently cancelled. Can we get her back in the office.

## 2012-12-17 NOTE — Telephone Encounter (Signed)
I could see her on 3/3 at 1:00

## 2012-12-22 ENCOUNTER — Ambulatory Visit (HOSPITAL_BASED_OUTPATIENT_CLINIC_OR_DEPARTMENT_OTHER): Payer: BC Managed Care – PPO | Admitting: Oncology

## 2012-12-22 ENCOUNTER — Telehealth: Payer: Self-pay | Admitting: Oncology

## 2012-12-22 ENCOUNTER — Ambulatory Visit: Payer: BC Managed Care – PPO | Admitting: Oncology

## 2012-12-22 ENCOUNTER — Telehealth: Payer: Self-pay | Admitting: *Deleted

## 2012-12-22 ENCOUNTER — Encounter: Payer: Self-pay | Admitting: Oncology

## 2012-12-22 ENCOUNTER — Other Ambulatory Visit (HOSPITAL_BASED_OUTPATIENT_CLINIC_OR_DEPARTMENT_OTHER): Payer: BC Managed Care – PPO | Admitting: Lab

## 2012-12-22 ENCOUNTER — Other Ambulatory Visit: Payer: BC Managed Care – PPO | Admitting: Lab

## 2012-12-22 VITALS — BP 120/81 | HR 77 | Temp 98.1°F | Resp 20 | Ht 64.0 in | Wt 150.8 lb

## 2012-12-22 DIAGNOSIS — C50419 Malignant neoplasm of upper-outer quadrant of unspecified female breast: Secondary | ICD-10-CM

## 2012-12-22 DIAGNOSIS — R52 Pain, unspecified: Secondary | ICD-10-CM

## 2012-12-22 DIAGNOSIS — C50912 Malignant neoplasm of unspecified site of left female breast: Secondary | ICD-10-CM

## 2012-12-22 DIAGNOSIS — C50919 Malignant neoplasm of unspecified site of unspecified female breast: Secondary | ICD-10-CM

## 2012-12-22 DIAGNOSIS — Z170 Estrogen receptor positive status [ER+]: Secondary | ICD-10-CM

## 2012-12-22 LAB — CBC WITH DIFFERENTIAL/PLATELET
BASO%: 0.4 % (ref 0.0–2.0)
EOS%: 0.5 % (ref 0.0–7.0)
HCT: 42.2 % (ref 34.8–46.6)
MCH: 30.2 pg (ref 25.1–34.0)
MCHC: 34.5 g/dL (ref 31.5–36.0)
MONO#: 0.5 10*3/uL (ref 0.1–0.9)
RDW: 14.6 % — ABNORMAL HIGH (ref 11.2–14.5)
WBC: 5.2 10*3/uL (ref 3.9–10.3)
lymph#: 1.5 10*3/uL (ref 0.9–3.3)

## 2012-12-22 LAB — COMPREHENSIVE METABOLIC PANEL (CC13)
ALT: 15 U/L (ref 0–55)
AST: 19 U/L (ref 5–34)
Albumin: 3.9 g/dL (ref 3.5–5.0)
CO2: 28 mEq/L (ref 22–29)
Calcium: 9.7 mg/dL (ref 8.4–10.4)
Chloride: 103 mEq/L (ref 98–107)
Potassium: 3.8 mEq/L (ref 3.5–5.1)
Sodium: 139 mEq/L (ref 136–145)
Total Protein: 7.5 g/dL (ref 6.4–8.3)

## 2012-12-22 NOTE — Telephone Encounter (Signed)
gv pt appt schedule for September.  °

## 2012-12-22 NOTE — Telephone Encounter (Signed)
Per patient voicemail, I have forwarded the mesasge to the desk RN regarding her appts.    JMW

## 2012-12-22 NOTE — Progress Notes (Signed)
OFFICE PROGRESS NOTE Dr. 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 She was initially treated with neoadjuvant chemotherapy for a 3.5 cm node positive stage IIB disease.  PRIOR THERAPY:  #1 status post 4 cycles of dose dense Adriamycin Cytoxan followed by Taxol Herceptin then Abraxane and Herceptin do to intolerable side effects from the Taxol specifically neuropathy. She completed a 12 week course of therapy.  #2 status post left lumpectomy with sentinel node dissection revealing T0 N0 disease. Patient had a complete pathologic response.  #3 vision then went on to have radiation therapy with concomitant Herceptin.  #4 patient is now being maintained on Herceptin every 3 weeks. She is due to complete all of her therapy in March 2013.  #5 patient is receiving concurrent tamoxifen 20 mg daily since completion of her radiation.  CURRENT THERAPY: Tamoxifen 20 mg  since March 2013..  INTERVAL HISTORY: [name] 52 y.o. female returns for followup visit. . She continues to have problems with her legs especially the knee joints. I think this may just be do to overuse from her work as well as her exercise routine. No nausea or vomiting, no fevers or chills. No headaches, she has joint pains bilaterally in the knee. She is also having significant side effects from tamoxifen including hot flashes irritability sometimes. She is most concerned about her aches and pains and joint of and muscles. She is continuing to exercise.Remainder of the 10 point review of systems is negative.    MEDICAL HISTORY: Past Medical History  Diagnosis Date  . Peripheral neuropathy   . Breast cancer Oct 2011    left    ALLERGIES:  has No Known Allergies.  MEDICATIONS:  Current Outpatient Prescriptions  Medication Sig Dispense Refill  . Multiple Vitamin  (MULTIVITAMIN) capsule Take 1 capsule by mouth daily.       . tamoxifen (NOLVADEX) 20 MG tablet TAKE ONE TABLET BY MOUTH EVERY DAY  30 tablet  PRN  . naproxen sodium (ANAPROX) 220 MG tablet Take 440 mg by mouth 2 (two) times daily as needed. For pain       No current facility-administered medications for this visit.    SURGICAL HISTORY:  Past Surgical History  Procedure Laterality Date  . Breast lumpectomy    . Portacath placement    . Port-a-cath removal  03/10/2012    Procedure: REMOVAL PORT-A-CATH;  Surgeon: Clovis Pu. Cornett, MD;  Location: [facility];  Service: General;  Laterality: N/A;    REVIEW OF SYSTEMS:  Pertinent items are noted in HPI.   PHYSICAL EXAMINATION: General appearance: alert, cooperative and appears stated age Head: Normocephalic, without obvious abnormality, atraumatic Neck: no adenopathy, no carotid bruit, no JVD, supple, symmetrical, trachea midline and thyroid not enlarged, symmetric, no tenderness/mass/nodules Lymph nodes: Cervical, supraclavicular, and axillary nodes normal. Resp: clear to auscultation bilaterally and normal percussion bilaterally Back: symmetric, no curvature. ROM normal. No CVA tenderness. Cardio: regular rate and rhythm, S1, S2 normal, no murmur, click, rub or gallop and normal apical impulse GI: soft, non-tender; bowel sounds normal; no masses,  no organomegaly Extremities: extremities normal, atraumatic, no cyanosis or edema Neurologic: Alert and oriented X 3, normal strength and tone. Normal symmetric reflexes. Normal coordination and gait  ECOG PERFORMANCE STATUS: 1 - Symptomatic but completely ambulatory  Blood pressure 120/81, pulse 77, temperature 98.1 F (36.7 C), temperature source Oral, resp. rate 20, height 5'  4" (1.626 m), weight 150 lb 12.8 oz (68.402 kg).  LABORATORY DATA: Lab Results  Component Value Date   WBC 5.2 12/22/2012   HGB 14.6 12/22/2012   HCT 42.2 12/22/2012   MCV 87.4 12/22/2012   PLT 125* 12/22/2012       Chemistry      Component Value Date/Time   NA 142 07/07/2012 0919   NA 142 03/10/2012 0735   K 4.2 07/07/2012 0919   K 4.4 03/10/2012 0735   CL 107 07/07/2012 0919   CL 104 03/10/2012 0735   CO2 27 07/07/2012 0919   CO2 31 03/10/2012 0735   BUN 15.0 07/07/2012 0919   BUN 13 03/10/2012 0735   CREATININE 0.8 07/07/2012 0919   CREATININE 0.62 03/10/2012 0735      Component Value Date/Time   CALCIUM 9.7 07/07/2012 0919   CALCIUM 9.4 03/10/2012 0735   ALKPHOS 46 07/07/2012 0919   ALKPHOS 60 11/05/2011 0921   AST 20 07/07/2012 0919   AST 22 11/05/2011 0921   ALT 12 07/07/2012 0919   ALT 12 11/05/2011 0921   BILITOT 1.00 07/07/2012 0919   BILITOT 0.8 11/05/2011 0921       RADIOGRAPHIC STUDIES:  No results found.  ASSESSMENT: 52 year old female with:  1.  stage IIB ER/PR and her2Neu positive breast cancer. S/p neoadjuvant chemotherapy. She then underwent a lumpectomy with sentinel node biopsy. The final pathology showed a complete pathologic response.   2.Patient went on to complete radiation as well as one years worth of Herceptin. She was then started on tamoxifen 20 mg daily in March 2013. Patient is having considerable problems with tamoxifen. She would like to come off of it and we discussed the rationale for not doing that. She therefore will be taking her tamoxifen every other day to see if this helps with her symptoms of hot flashes.  PLAN:   #1 patient will continue tamoxifen 20 mg every other day  #2 for her aches and pains I have recommended Glucosamine chondroitin sulfate.I have also encouraged her to take Neurontin for her aches and pains and neuropathy.  #3 she will return in 6 months time for followup.    All questions were answered. The patient knows to call the clinic with any problems, questions or concerns. We can certainly see the patient much sooner if necessary.  I spent 25 minutes counseling the patient face to face. The total time spent in the appointment was 30  minutes.    Drue Second, MD Medical/Oncology [facility] [phone] (beeper) [phone] (Office)  12/22/2012, 3:58 PM

## 2012-12-22 NOTE — Patient Instructions (Addendum)
Continue tamoxifen 20 mg every other day  Take super B complex  Take lysene tabs  I will see you back in 6 months

## 2012-12-23 LAB — VITAMIN D 25 HYDROXY (VIT D DEFICIENCY, FRACTURES): Vit D, 25-Hydroxy: 34 ng/mL (ref 30–89)

## 2012-12-31 ENCOUNTER — Ambulatory Visit: Payer: BC Managed Care – PPO | Admitting: Oncology

## 2012-12-31 ENCOUNTER — Other Ambulatory Visit: Payer: BC Managed Care – PPO | Admitting: Lab

## 2013-02-25 ENCOUNTER — Encounter: Payer: Self-pay | Admitting: Oncology

## 2013-02-25 ENCOUNTER — Telehealth: Payer: Self-pay | Admitting: Medical Oncology

## 2013-02-25 NOTE — Telephone Encounter (Signed)
Patient called regarding a bill she received, states she spoke with billing already but does not understand why she received a bill "from an office visit last year in February. I paid for the visit I just had this March." Informed pt that I am unable to answer this question for her as much as I may like to help her. Transferred pt to managed care to see if they could assist pt with this.  During call patient also wanted me to let Dr Welton Flakes know that she continues with having neuropathy to her feet. States the neurontin did not help "I just had weight gain" and she tried a super B complex as suggested, and that did not help either. Informed pt will review with MD.

## 2013-02-25 NOTE — Progress Notes (Signed)
Mailed requested EPP application to pt today.

## 2013-02-25 NOTE — Progress Notes (Signed)
Patient called and need asst for bills. I gave to Lenise to send her an EPP.

## 2013-06-24 ENCOUNTER — Telehealth: Payer: Self-pay | Admitting: Medical Oncology

## 2013-06-24 NOTE — Telephone Encounter (Signed)
Patient needs to keep the appointment for oncologic follow up  For fluid retention she should see her primary care physician

## 2013-06-24 NOTE — Telephone Encounter (Signed)
Patient called stating she has "been retaining a lot of fluid since starting tomoxifen," clothes are tight despite daily exercise and asking if there is anything she can take for the fluid.  Informed patient she has appt with MD this Monday 09/08, patient states "I don't think I will be able to make that appt." Encouraged pt that this appt would be good for her to discuss with Dr Welton Flakes the issues pt is having with tomoxifen . Patient states "I have a prior engagement." Informed patient will review with MD regarding fluid retention and suggestions  LOV 12/22/12

## 2013-06-24 NOTE — Telephone Encounter (Signed)
Per MD, advised pt to keep appt for oncological f/u and to see her PCP regarding fluid retention. Patient stated she will need to call office back regarding appt.

## 2013-06-29 ENCOUNTER — Other Ambulatory Visit (HOSPITAL_BASED_OUTPATIENT_CLINIC_OR_DEPARTMENT_OTHER): Payer: BC Managed Care – PPO | Admitting: Lab

## 2013-06-29 ENCOUNTER — Ambulatory Visit (HOSPITAL_BASED_OUTPATIENT_CLINIC_OR_DEPARTMENT_OTHER): Payer: BC Managed Care – PPO | Admitting: Oncology

## 2013-06-29 ENCOUNTER — Encounter: Payer: Self-pay | Admitting: Oncology

## 2013-06-29 ENCOUNTER — Telehealth: Payer: Self-pay | Admitting: *Deleted

## 2013-06-29 VITALS — BP 126/83 | HR 76 | Temp 98.2°F | Resp 18 | Ht 64.0 in | Wt 153.7 lb

## 2013-06-29 DIAGNOSIS — Z853 Personal history of malignant neoplasm of breast: Secondary | ICD-10-CM

## 2013-06-29 DIAGNOSIS — C50419 Malignant neoplasm of upper-outer quadrant of unspecified female breast: Secondary | ICD-10-CM

## 2013-06-29 DIAGNOSIS — Z170 Estrogen receptor positive status [ER+]: Secondary | ICD-10-CM

## 2013-06-29 DIAGNOSIS — M79609 Pain in unspecified limb: Secondary | ICD-10-CM

## 2013-06-29 DIAGNOSIS — C50912 Malignant neoplasm of unspecified site of left female breast: Secondary | ICD-10-CM

## 2013-06-29 LAB — CBC WITH DIFFERENTIAL/PLATELET
BASO%: 0.5 % (ref 0.0–2.0)
Basophils Absolute: 0 10*3/uL (ref 0.0–0.1)
EOS%: 0.6 % (ref 0.0–7.0)
HGB: 14 g/dL (ref 11.6–15.9)
MCH: 29.7 pg (ref 25.1–34.0)
MCHC: 34.3 g/dL (ref 31.5–36.0)
MCV: 86.6 fL (ref 79.5–101.0)
MONO%: 9.8 % (ref 0.0–14.0)
RDW: 13.2 % (ref 11.2–14.5)

## 2013-06-29 LAB — COMPREHENSIVE METABOLIC PANEL (CC13)
ALT: 16 U/L (ref 0–55)
AST: 22 U/L (ref 5–34)
Albumin: 4.1 g/dL (ref 3.5–5.0)
Alkaline Phosphatase: 50 U/L (ref 40–150)
BUN: 15.6 mg/dL (ref 7.0–26.0)
Creatinine: 0.8 mg/dL (ref 0.6–1.1)
Potassium: 4.4 mEq/L (ref 3.5–5.1)

## 2013-06-29 MED ORDER — TAMOXIFEN CITRATE 20 MG PO TABS: 20.0000 mg | ORAL_TABLET | Freq: Every day | ORAL | Status: DC

## 2013-06-29 NOTE — Telephone Encounter (Signed)
appts made and printed. Pt is aware that i lm @ [facility] w/ her info along w/ mines and now waiting for someone to return my call....td

## 2013-06-29 NOTE — Progress Notes (Signed)
OFFICE PROGRESS NOTE Dr. 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 She was initially treated with neoadjuvant chemotherapy for a 3.5 cm node positive stage IIB disease.  PRIOR THERAPY:  #1 status post 4 cycles of dose dense Adriamycin Cytoxan followed by Taxol Herceptin then Abraxane and Herceptin do to intolerable side effects from the Taxol specifically neuropathy. She completed a 12 week course of therapy.  #2 status post left lumpectomy with sentinel node dissection revealing T0 N0 disease. Patient had a complete pathologic response.  #3 vision then went on to have radiation therapy with concomitant Herceptin.  #4 patient is now being maintained on Herceptin every 3 weeks. She is due to complete all of her therapy in March 2013.  #5 patient is receiving concurrent tamoxifen 20 mg daily since completion of her radiation.  CURRENT THERAPY: Tamoxifen 20 mg  since March 2013..  INTERVAL HISTORY: [name] 52 y.o. female returns for followup visit. . patient is very frustrated as she is exercising and still continues to maintain her weight and in fact she feels that she is not as good as she was prior to her diagnosis of the cancer. She does have hot flashes. She is also concerned about her feet. She has ongoing issues with the balls of her feet. We discussed the possibility of her being referred to podiatry for further evaluation. She otherwise denies any nausea vomiting fevers chills night sweats. She continues to have hot flashes. Remainder of the 10 point review of systems is unremarkable.    MEDICAL HISTORY: Past Medical History  Diagnosis Date  . Peripheral neuropathy   . Breast cancer Oct 2011    left    ALLERGIES:  has No Known Allergies.  MEDICATIONS:  Current Outpatient Prescriptions  Medication Sig Dispense Refill  .  Multiple Vitamin (MULTIVITAMIN) capsule Take 1 capsule by mouth daily.       . tamoxifen (NOLVADEX) 20 MG tablet TAKE ONE TABLET BY MOUTH EVERY DAY  30 tablet  PRN  . naproxen sodium (ANAPROX) 220 MG tablet Take 440 mg by mouth 2 (two) times daily as needed. For pain       No current facility-administered medications for this visit.    SURGICAL HISTORY:  Past Surgical History  Procedure Laterality Date  . Breast lumpectomy    . Portacath placement    . Port-a-cath removal  03/10/2012    Procedure: REMOVAL PORT-A-CATH;  Surgeon: Clovis Pu. Cornett, MD;  Location: [facility];  Service: General;  Laterality: N/A;    REVIEW OF SYSTEMS:  Pertinent items are noted in HPI.   PHYSICAL EXAMINATION: General appearance: alert, cooperative and appears stated age Head: Normocephalic, without obvious abnormality, atraumatic Neck: no adenopathy, no carotid bruit, no JVD, supple, symmetrical, trachea midline and thyroid not enlarged, symmetric, no tenderness/mass/nodules Lymph nodes: Cervical, supraclavicular, and axillary nodes normal. Resp: clear to auscultation bilaterally and normal percussion bilaterally Back: symmetric, no curvature. ROM normal. No CVA tenderness. Cardio: regular rate and rhythm, S1, S2 normal, no murmur, click, rub or gallop and normal apical impulse GI: soft, non-tender; bowel sounds normal; no masses,  no organomegaly Extremities: extremities normal, atraumatic, no cyanosis or edema Neurologic: Alert and oriented X 3, normal strength and tone. Normal symmetric reflexes. Normal coordination and gait  ECOG PERFORMANCE STATUS: 1 - Symptomatic but completely ambulatory  Blood pressure 126/83, pulse 76, temperature 98.2 F (36.8 C), temperature  source Oral, resp. rate 18, height 5\' 4"  (1.626 m), weight 153 lb 11.2 oz (69.718 kg).  LABORATORY DATA: Lab Results  Component Value Date   WBC 3.4* 06/29/2013   HGB 14.0 06/29/2013   HCT 40.8 06/29/2013   MCV 86.6 06/29/2013   PLT 123*  06/29/2013      Chemistry      Component Value Date/Time   NA 139 12/22/2012 1526   NA 142 03/10/2012 0735   K 3.8 12/22/2012 1526   K 4.4 03/10/2012 0735   CL 103 12/22/2012 1526   CL 104 03/10/2012 0735   CO2 28 12/22/2012 1526   CO2 31 03/10/2012 0735   BUN 15.5 12/22/2012 1526   BUN 13 03/10/2012 0735   CREATININE 0.8 12/22/2012 1526   CREATININE 0.62 03/10/2012 0735      Component Value Date/Time   CALCIUM 9.7 12/22/2012 1526   CALCIUM 9.4 03/10/2012 0735   ALKPHOS 47 12/22/2012 1526   ALKPHOS 60 11/05/2011 0921   AST 19 12/22/2012 1526   AST 22 11/05/2011 0921   ALT 15 12/22/2012 1526   ALT 12 11/05/2011 0921   BILITOT 1.00 12/22/2012 1526   BILITOT 0.8 11/05/2011 0921       RADIOGRAPHIC STUDIES:  No results found.  ASSESSMENT: 52 year old female with:  1.  stage IIB ER/PR and her2Neu positive breast cancer. S/p neoadjuvant chemotherapy. She then underwent a lumpectomy with sentinel node biopsy. The final pathology showed a complete pathologic response.   2.Patient went on to complete radiation as well as one years worth of Herceptin. She was then started on tamoxifen 20 mg daily in March 2013. Patient is having considerable problems with tamoxifen. She would like to come off of it and we discussed the rationale for not doing that. She therefore will be taking her tamoxifen every other day to see if this helps with her symptoms of hot flashes.  #3 chronic foot pain PLAN:   #1 patient will continue tamoxifen 20 mg every  day  #2 patient will continue exercise and eating healthy. We also discussed the possibility of checking her hormone levels for her next visit and see if we would be able to switch her to an aromatase inhibitor. However after I discussed the side effects with her she stated that she did not want to be on that drug either. She only wants to take tamoxifen for total 5 years.  #3 referral to podiatry for chronic foot pain  #4 she will return in 6 months time for  followup.    All questions were answered. The patient knows to call the clinic with any problems, questions or concerns. We can certainly see the patient much sooner if necessary.  I spent 25 minutes counseling the patient face to face. The total time spent in the appointment was 30 minutes.    Drue Second, MD Medical/Oncology [facility] [phone] (beeper) [phone] (Office)  06/29/2013, 12:15 PM

## 2013-06-29 NOTE — Patient Instructions (Addendum)
Continue tamoxifen 20 mg daily  Refer to podiatrist  I will see you back in 6 months

## 2013-06-30 ENCOUNTER — Telehealth: Payer: Self-pay | Admitting: *Deleted

## 2013-06-30 NOTE — Telephone Encounter (Signed)
Peggy from [facility] will call the pt with an appt...td

## 2013-07-13 ENCOUNTER — Ambulatory Visit: Payer: Self-pay | Admitting: Podiatry

## 2013-07-20 ENCOUNTER — Ambulatory Visit (INDEPENDENT_AMBULATORY_CARE_PROVIDER_SITE_OTHER): Payer: BC Managed Care – PPO | Admitting: Podiatry

## 2013-07-20 ENCOUNTER — Encounter: Payer: Self-pay | Admitting: Podiatry

## 2013-07-20 VITALS — BP 116/81 | HR 68 | Ht 64.0 in | Wt 150.0 lb

## 2013-07-20 DIAGNOSIS — M25579 Pain in unspecified ankle and joints of unspecified foot: Secondary | ICD-10-CM | POA: Insufficient documentation

## 2013-07-20 DIAGNOSIS — M21969 Unspecified acquired deformity of unspecified lower leg: Secondary | ICD-10-CM

## 2013-07-20 NOTE — Patient Instructions (Addendum)
Seen for painful feet.  Advised to do tendon stretch exercise.

## 2013-07-20 NOTE — Progress Notes (Signed)
Subjective: 52 year old female presents stating that she has painful feet that has gotten worse after cancer treatment. Initially she has had both hands and feet numbness and pain right after the treatment. Now only her feet hurts with burning pain. Cancer treatment completed 2 years ago.  She is on feet all day about 8-12 hours working two jobs.  Stated that she did have foot pain off and on some degree especially after been on feet long hours even before the cancer treatment. But the discomfort is much worse now.   Review of Systems - General ROS: negative Ophthalmic ROS: negative ENT ROS: negative Allergy and Immunology ROS: negative Breast ROS: Stated that she is cancer free. Respiratory ROS: no cough, shortness of breath, or wheezing Cardiovascular ROS: no chest pain or dyspnea on exertion Gastrointestinal ROS: no abdominal pain, change in bowel habits, or black or bloody stools Musculoskeletal ROS: negative Neurological ROS: negative  Objective: Dermatologic: Normal skin without any visible lesions. All nails are covered with polished color. Vascular: All pedal pulses are palpable. No edema or erythema.  Orthopedic: Bilateral bunion deformity with dorsal displacement of the first ray right>left. Tight Achilles tendon bilateral right > left. Neurologic: Subjective paresthesia and discomfort ball of both feet withafter prolonged weight bearing.  All epicritic and tactile sensations grossly intact.  Assessment: Peripheral neuropathy with history of cancer treatment. Abnormal biomechanics bilateral - Forefoot varus leading to abnormal weight shifting on ball of both feet. 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  Plan: Discussed clinical findings.  Tight Achilles tendon can cause abnormal compensation and excess uneven weight bearing on ball of foot. Elevated and unstable first Metatarsal bone can also cause excess weight loading on ball  of foot. Compensated subtalar joint can also cause excess tension at Posterior tibial nerve and lead to neuropathic foot pain on ball of feet.  Post chemotherapy can aggravate or initiate neurologic foot pain.  Discussed available treatment options such as stretch exercise for tight Achilles tendon, custom made Orthotic shoe inserts, pain control with compounding cream, and proper shoe selection. Also revealed possible surgical options when conservative treatment fails to improve painful condition.   Upon completion of the above explanation and discussion, patient expressed some degree of dissatisfaction and decided to purchase OTC orthotics from [facility], self stretch exercise and did not care for topical cream.  Patient did not want follow up appointment, but will call for appointment as needed.

## 2013-07-24 ENCOUNTER — Telehealth: Payer: Self-pay | Admitting: *Deleted

## 2013-07-24 NOTE — Telephone Encounter (Signed)
Pt called [facility] with concerns about tamoxifen " Pharmacy said Rx was expired" Call made to Pt's pharmacy on file [facility] in [facility], pt's rx for Tamoxifen is ready for pick up and they have a different phone # for pt on file. Pt will need to update her contact information upon p/u of meds. Called pt unable to reach, [facility] - pt's tamoxifen ready for pick and to update contact info with pharmacy

## 2013-11-02 ENCOUNTER — Other Ambulatory Visit: Payer: Self-pay | Admitting: Oncology

## 2013-11-02 DIAGNOSIS — Z853 Personal history of malignant neoplasm of breast: Secondary | ICD-10-CM

## 2013-11-23 ENCOUNTER — Ambulatory Visit
Admission: RE | Admit: 2013-11-23 | Discharge: 2013-11-23 | Disposition: A | Discharge status: Home / Self Care | Payer: BC Managed Care – PPO | Source: Ambulatory Visit | Attending: Oncology | Admitting: Oncology

## 2013-11-23 DIAGNOSIS — Z853 Personal history of malignant neoplasm of breast: Secondary | ICD-10-CM

## 2013-11-23 NOTE — Progress Notes (Signed)
Quick Note:  Please call patient: mammogram no evidence of recurrent cancer in breasts ______

## 2013-12-28 ENCOUNTER — Ambulatory Visit (HOSPITAL_BASED_OUTPATIENT_CLINIC_OR_DEPARTMENT_OTHER): Payer: BC Managed Care – PPO | Admitting: Oncology

## 2013-12-28 ENCOUNTER — Other Ambulatory Visit: Payer: BC Managed Care – PPO

## 2013-12-28 ENCOUNTER — Telehealth: Payer: Self-pay | Admitting: Oncology

## 2013-12-28 ENCOUNTER — Encounter: Payer: Self-pay | Admitting: Oncology

## 2013-12-28 VITALS — BP 127/82 | HR 67 | Temp 98.4°F | Resp 18 | Ht 64.0 in | Wt 155.0 lb

## 2013-12-28 DIAGNOSIS — M79609 Pain in unspecified limb: Secondary | ICD-10-CM

## 2013-12-28 DIAGNOSIS — C50919 Malignant neoplasm of unspecified site of unspecified female breast: Secondary | ICD-10-CM

## 2013-12-28 DIAGNOSIS — Z170 Estrogen receptor positive status [ER+]: Secondary | ICD-10-CM

## 2013-12-28 DIAGNOSIS — N959 Unspecified menopausal and perimenopausal disorder: Secondary | ICD-10-CM

## 2013-12-28 DIAGNOSIS — C50912 Malignant neoplasm of unspecified site of left female breast: Secondary | ICD-10-CM

## 2013-12-28 LAB — COMPREHENSIVE METABOLIC PANEL (CC13)
ALBUMIN: 4 g/dL (ref 3.5–5.0)
ALT: 18 U/L (ref 0–55)
ANION GAP: 8 meq/L (ref 3–11)
AST: 20 U/L (ref 5–34)
Alkaline Phosphatase: 49 U/L (ref 40–150)
BUN: 11 mg/dL (ref 7.0–26.0)
CALCIUM: 9.5 mg/dL (ref 8.4–10.4)
CHLORIDE: 105 meq/L (ref 98–109)
CO2: 29 meq/L (ref 22–29)
Creatinine: 0.7 mg/dL (ref 0.6–1.1)
GLUCOSE: 128 mg/dL (ref 70–140)
POTASSIUM: 4.1 meq/L (ref 3.5–5.1)
SODIUM: 142 meq/L (ref 136–145)
TOTAL PROTEIN: 7.2 g/dL (ref 6.4–8.3)
Total Bilirubin: 0.69 mg/dL (ref 0.20–1.20)

## 2013-12-28 LAB — CBC WITH DIFFERENTIAL/PLATELET
BASO%: 0.4 % (ref 0.0–2.0)
Basophils Absolute: 0 10*3/uL (ref 0.0–0.1)
EOS ABS: 0 10*3/uL (ref 0.0–0.5)
EOS%: 0.6 % (ref 0.0–7.0)
HCT: 40.8 % (ref 34.8–46.6)
HGB: 13.5 g/dL (ref 11.6–15.9)
LYMPH#: 0.9 10*3/uL (ref 0.9–3.3)
LYMPH%: 26.8 % (ref 14.0–49.7)
MCH: 29.4 pg (ref 25.1–34.0)
MCHC: 33.1 g/dL (ref 31.5–36.0)
MCV: 88.8 fL (ref 79.5–101.0)
MONO#: 0.3 10*3/uL (ref 0.1–0.9)
MONO%: 9.9 % (ref 0.0–14.0)
NEUT%: 62.3 % (ref 38.4–76.8)
NEUTROS ABS: 2.1 10*3/uL (ref 1.5–6.5)
PLATELETS: 112 10*3/uL — AB (ref 145–400)
RBC: 4.59 10*6/uL (ref 3.70–5.45)
RDW: 13.3 % (ref 11.2–14.5)
WBC: 3.4 10*3/uL — AB (ref 3.9–10.3)

## 2013-12-28 MED ORDER — EXEMESTANE 25 MG PO TABS: 25.0000 mg | ORAL_TABLET | Freq: Every day | ORAL | Status: DC

## 2013-12-28 MED ORDER — PAROXETINE HCL 20 MG PO TABS: 20.0000 mg | ORAL_TABLET | Freq: Every day | ORAL | Status: DC

## 2013-12-28 NOTE — Progress Notes (Signed)
OFFICE PROGRESS NOTE Dr. 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 She was initially treated with neoadjuvant chemotherapy for a 3.5 cm node positive stage IIB disease.   Invasive ductal carcinoma of breast, stage 2   Primary site: Breast (Left)   Staging method: AJCC 7th Edition   Clinical: Stage IIB (T2, N1, cM0) signed by [name], MD on 12/28/2013  3:14 PM   Summary: Stage IIB (T2, N1, cM0)  PRIOR THERAPY:  #1 status post 4 cycles of dose dense Adriamycin Cytoxan followed by Taxol Herceptin then Abraxane and Herceptin do to intolerable side effects from the Taxol specifically neuropathy. She completed a 12 week course of therapy.  #2 status post left lumpectomy with sentinel node dissection revealing T0 N0 disease. Patient had a complete pathologic response.  #3 vision then went on to have radiation therapy with concomitant Herceptin.  #4 patient is now being maintained on Herceptin every 3 weeks. She is due to complete all of her therapy in March 2013.  #5 patient is receiving concurrent tamoxifen 20 mg daily from march 2013 - March 2015.   #6 aromasin 25 mg daily beginning 12/28/13  CURRENT THERAPY: aromasin 25 mg daily  INTERVAL HISTORY: [name] 53 y.o. female returns for followup visit. Patient has begun taking estroven for the last few months. She tells me that this has really helped her hot flashes and her mood swings as well so bloating that she was experiencing. She continues to take tamoxifen on a regular basis. Patient and I went over the chemical makeup of estroven in detail today. She understands now that he does contain soap products black cohosh. In the use in the ER positive patients with this product is uncertain and the studies are very limited. We never know what is the chemical makeup of supplements. After  extensive discussion patient decided to come off of the estroven. However she is very bothered by the fact that she will begin having hot flashes again. We discussed the possibility of the patient starting Paxil to help prevent this. However she also understands that being on tamoxifen there may be a drug drug interactions. Patient is now postmenopausal therefore I did suggest that she switched to Aromasin instead of tamoxifen. She was willing to do this. Today she denies any headaches double vision blurring of vision fevers chills night sweats. No shortness of breath chest pains palpitations. No abdominal pain no diarrhea or constipation. She has no easy bruising or bleeding. She has no myalgias and arthralgias. No peripheral paresthesias or gait disturbances. Remainder of the 10 point review of systems is negative.    MEDICAL HISTORY: Past Medical History  Diagnosis Date  . Peripheral neuropathy   . Breast cancer Oct 2011    left    ALLERGIES:  has No Known Allergies.  MEDICATIONS:  Current Outpatient Prescriptions  Medication Sig Dispense Refill  . Multiple Vitamin (MULTIVITAMIN) capsule Take 1 capsule by mouth daily.       . tamoxifen (NOLVADEX) 20 MG tablet Take 1 tablet (20 mg total) by mouth daily.  30 tablet  12   No current facility-administered medications for this visit.    SURGICAL HISTORY:  Past Surgical History  Procedure Laterality Date  . Breast lumpectomy    . Portacath placement    . Port-a-cath removal  03/10/2012    Procedure: REMOVAL PORT-A-CATH;  Surgeon: Joyice Faster.  Cornett, MD;  Location: [facility];  Service: General;  Laterality: N/A;    REVIEW OF SYSTEMS:  Pertinent items are noted in HPI.   PHYSICAL EXAMINATION: General appearance: alert, cooperative and appears stated age Head: Normocephalic, without obvious abnormality, atraumatic Neck: no adenopathy, no carotid bruit, no JVD, supple, symmetrical, trachea midline and thyroid not enlarged, symmetric, no  tenderness/mass/nodules Lymph nodes: Cervical, supraclavicular, and axillary nodes normal. Resp: clear to auscultation bilaterally and normal percussion bilaterally Back: symmetric, no curvature. ROM normal. No CVA tenderness. Cardio: regular rate and rhythm, S1, S2 normal, no murmur, click, rub or gallop and normal apical impulse GI: soft, non-tender; bowel sounds normal; no masses,  no organomegaly Extremities: extremities normal, atraumatic, no cyanosis or edema Neurologic: Alert and oriented X 3, normal strength and tone. Normal symmetric reflexes. Normal coordination and gait  ECOG PERFORMANCE STATUS: 1 - Symptomatic but completely ambulatory  Blood pressure 127/82, pulse 67, temperature 98.4 F (36.9 C), temperature source Oral, resp. rate 18, height _0  (1.626 m), weight 155 lb (70.308 kg).  LABORATORY DATA: Lab Results  Component Value Date   WBC 3.4* 12/28/2013   HGB 13.5 12/28/2013   HCT 40.8 12/28/2013   MCV 88.8 12/28/2013   PLT 112* 12/28/2013      Chemistry      Component Value Date/Time   NA 142 12/28/2013 1015   NA 142 03/10/2012 0735   K 4.1 12/28/2013 1015   K 4.4 03/10/2012 0735   CL 103 12/22/2012 1526   CL 104 03/10/2012 0735   CO2 29 12/28/2013 1015   CO2 31 03/10/2012 0735   BUN 11.0 12/28/2013 1015   BUN 13 03/10/2012 0735   CREATININE 0.7 12/28/2013 1015   CREATININE 0.62 03/10/2012 0735      Component Value Date/Time   CALCIUM 9.5 12/28/2013 1015   CALCIUM 9.4 03/10/2012 0735   ALKPHOS 49 12/28/2013 1015   ALKPHOS 60 11/05/2011 0921   AST 20 12/28/2013 1015   AST 22 11/05/2011 0921   ALT 18 12/28/2013 1015   ALT 12 11/05/2011 0921   BILITOT 0.69 12/28/2013 1015   BILITOT 0.8 11/05/2011 0921       RADIOGRAPHIC STUDIES:  No results found.  ASSESSMENT/PLAN: 53 year old female with:  1.  stage IIB ER/PR and her2Neu positive breast cancer. S/p neoadjuvant chemotherapy. She then underwent a lumpectomy with sentinel node biopsy. The final pathology showed a complete pathologic  response. Patient was subsequently begun on tamoxifen. Her primary concern has been hot flashes bloating and mood swings. I recommended the possibility of switching her over to an aromatase inhibitor such as Aromasin. Her last menstrual period was 3 years ago. I do think she is in menopause. We discussed side effects of Aromasin including but not limited to hot flashes aches and pains mood swings and celebration of osteoporosis.   #2 chronic foot pain: Patient was referred to podiatry but unfortunately she did not get on well with them. She feels that they did not help her at all. We'll continue symptomatic management  #4 Hot Flashes: paroxetine 20 mg daily, hopefully this will also help with her mood swings.  #5 patient had been taking estroven recommended she discontinue this  #6 Follow up: 6 months with blood work and office visit, Mammograms up to date    All questions were answered. The patient knows to call the clinic with any problems, questions or concerns. We can certainly see the patient much sooner if necessary.  I spent 25 minutes  counseling the patient face to face. The total time spent in the appointment was 30 minutes.    Marcy Panning, MD Medical/Oncology [facility] ([place]) [phone] (beeper) [phone] (Office)  12/28/2013, 11:06 AM

## 2013-12-28 NOTE — Telephone Encounter (Signed)
, °

## 2013-12-28 NOTE — Patient Instructions (Signed)
Discontinue tamoxifen and ESTROVEN  Switch to aromasin 205 mg daily (this will be your new cancer medicine)  Begin paroxetine 20 mg daily for hot flashes  I will see you back in 6 months but call sooner if needed  Exemestane tablets What is this medicine? EXEMESTANE (ex e MES tane) blocks the production of the hormone estrogen. Some types of breast cancer depend on estrogen to grow, and this medicine can stop tumor growth by blocking estrogen production. This medicine is for the treatment of breast cancer in postmenopausal women only. This medicine may be used for other purposes; ask your health care provider or pharmacist if you have questions. COMMON BRAND NAME(S): Aromasin What should I tell my health care provider before I take this medicine? They need to know if you have any of these conditions: -an unusual or allergic reaction to exemestane, other medicines, foods, dyes, or preservatives -pregnant or trying to get pregnant -breast-feeding How should I use this medicine? Take this medicine by mouth with a glass of water. Follow the directions on the prescription label. Take your doses at regular intervals after a meal. Do not take your medicine more often than directed. Do not stop taking except on the advice of your doctor or health care professional. Contact your pediatrician regarding the use of this medicine in children. Special care may be needed. Overdosage: If you think you have taken too much of this medicine contact a poison control center or emergency room at once. NOTE: This medicine is only for you. Do not share this medicine with others. What if I miss a dose? If you miss a dose, take the next dose as usual. Do not try to make up the missed dose. Do not take double or extra doses. What may interact with this medicine? Do not take this medicine with any of the following medications: -female hormones, like estrogens and birth control pills This medicine may also interact  with the following medications: -androstenedione -phenytoin -rifabutin, rifampin, or rifapentine -St. John's Wort This list may not describe all possible interactions. Give your health care provider a list of all the medicines, herbs, non-prescription drugs, or dietary supplements you use. Also tell them if you smoke, drink alcohol, or use illegal drugs. Some items may interact with your medicine. What should I watch for while using this medicine? Visit your doctor or health care professional for regular checks on your progress. If you experience hot flashes or sweating while taking this medicine, avoid alcohol, smoking and drinks with caffeine. This may help to decrease these side effects. What side effects may I notice from receiving this medicine? Side effects that you should report to your doctor or health care professional as soon as possible: -any new or unusual symptoms -changes in vision -fever -leg or arm swelling -pain in bones, joints, or muscles -pain in hips, back, ribs, arms, shoulders, or legs Side effects that usually do not require medical attention (report to your doctor or health care professional if they continue or are bothersome): -difficulty sleeping -headache -hot flashes -sweating -unusually weak or tired This list may not describe all possible side effects. Call your doctor for medical advice about side effects. You may report side effects to FDA at 1-800-FDA-1088. Where should I keep my medicine? Keep out of the reach of children. Store at room temperature between 15 and 30 degrees C (59 and 86 degrees F). Throw away any unused medicine after the expiration date. NOTE: This sheet is a summary. It may  not cover all possible information. If you have questions about this medicine, talk to your doctor, pharmacist, or health care provider.  2014, Elsevier/Gold Standard. (2008-02-10 11:48:29)  Paroxetine capsules What is this medicine? PAROXETINE (pa ROX e teen)  is used to treat hot flashes due to menopause. This medicine may be used for other purposes; ask your health care provider or pharmacist if you have questions. COMMON BRAND NAME(S): Brisdelle What should I tell my health care provider before I take this medicine? They need to know if you have any of these conditions: -bleeding disorders -glaucoma -heart disease -kidney disease -liver disease -low levels of sodium in the blood -mania or bipolar disorder -seizures -suicidal thoughts, plans, or attempt; a previous suicide attempt by you or a family member -take MAOIs like Carbex, Eldepryl, Marplan, Nardil, and Parnate -take medicines that treat or prevent blood clots -an unusual or allergic reaction to paroxetine, other medicines, foods, dyes, or preservatives -pregnant or trying to get pregnant -breast-feeding How should I use this medicine? Take this medicine by mouth once daily at bedtime. Follow the directions on the prescription label. This medicine can be taken with or without food. Take your medicine at regular intervals. Do not take your medicine more often than directed. A special MedGuide will be given to you by the pharmacist with each prescription and refill. Be sure to read this information carefully each time. Overdosage: If you think you've taken too much of this medicine contact a poison control center or emergency room at once. Overdosage: If you think you have taken too much of this medicine contact a poison control center or emergency room at once. NOTE: This medicine is only for you. Do not share this medicine with others. What if I miss a dose? If you miss a dose, take it as soon as you can. If it is almost time for your next dose, take only that dose. Do not take double or extra doses. What may interact with this medicine? Do not take this medicine with any of the following medications: -linezolid -MAOIs like Carbex, Eldepryl, Marplan, Nardil, and Parnate -methylene  blue (injected into a vein) -pimozide -thioridazine This medicine may also interact with the following medications: -alcohol -aspirin and aspirin-like medicines -atomoxetine -certain medicines for depression, anxiety, or psychotic disturbances -certain medicines for irregular heart beat like propafenone, flecainide, encainide, and quinidine -certain medicines for migraine headache like almotriptan, eletriptan, frovatriptan, naratriptan, rizatriptan, sumatriptan, zolmitriptan -cimetidine -digoxin -diuretics -fentanyl -fosamprenavir -furazolidone -isoniazid -lithium -medicines that treat or prevent blood clots like warfarin, enoxaparin, and dalteparin -medicines for sleep -NSAIDs, medicines for pain and inflammation, like ibuprofen or naproxen -phenobarbital -phenytoin -procarbazine -rasagiline -ritonavir -supplements like St. John's wort, kava kava, valerian -tamoxifen -tramadol -tryptophan This list may not describe all possible interactions. Give your health care provider a list of all the medicines, herbs, non-prescription drugs, or dietary supplements you use. Also tell them if you smoke, drink alcohol, or use illegal drugs. Some items may interact with your medicine. What should I watch for while using this medicine? Tell your doctor or healthcare professional if your symptoms do not start to get better or if they get worse. Visit your doctor or health care professional for regular checks on your progress. Patients and their families should watch out for new or worsening thoughts of suicide or depression. Also watch out for sudden changes in feelings such as feeling anxious, agitated, panicky, irritable, hostile, aggressive, impulsive, severely restless, overly excited and hyperactive, or not being able to  sleep. If this happens, especially at the beginning of treatment or after a change in dose, call your health care professional. Dennis Bast may get drowsy or dizzy. Do not drive, use  machinery, or do anything that needs mental alertness until you know how this medicine affects you. Do not stand or sit up quickly, especially if you are an older patient. This reduces the risk of dizzy or fainting spells. Alcohol may interfere with the effect of this medicine. Avoid alcoholic drinks. Your mouth may get dry. Chewing sugarless gum, sucking hard candy and drinking plenty of water will help. Contact your doctor if the problem does not go away or is severe. Women should inform their doctor if they wish to become pregnant or think they might be pregnant. There is a potential for serious side effects to an unborn child. Talk to your health care professional or pharmacist for more information. Do not become pregnant while taking this medicine. What side effects may I notice from receiving this medicine? Side effects that you should report to your doctor or health care professional as soon as possible: -allergic reactions like skin rash, itching or hives, swelling of the face, lips, or tongue -changes in emotions or moods -confusion -depression -feeling faint or lightheaded, falls -seizures -suicidal thoughts or actions -unusual bleeding or bruising -unusually weak or tired -weakness Side effects that usually do not require medical attention (Report these to your doctor or health care professional if they continue or are bothersome.): -change in sex drive or performance -fatigue -drowsiness -headache -insomnia -nausea/vomiting -upset stomach This list may not describe all possible side effects. Call your doctor for medical advice about side effects. You may report side effects to FDA at 1-800-FDA-1088. Where should I keep my medicine? Keep out of the reach of children. Store at room temperature between 20 and 25 degrees C (68 and 77 degrees F). Throw away any unused medicine after the expiration date. NOTE: This sheet is a summary. It may not cover all possible information. If you  have questions about this medicine, talk to your doctor, pharmacist, or health care provider.  2014, Elsevier/Gold Standard. (2012-06-09 12:26:17)

## 2014-01-01 ENCOUNTER — Telehealth: Payer: Self-pay

## 2014-01-01 NOTE — Telephone Encounter (Signed)
Pt called lmovm re: medication changes from her appt with [facility] on 3/9.  She wants to continue Tamoxifen and not take anything new (Paroxetine?) - "no more chemicals".

## 2014-03-08 ENCOUNTER — Telehealth: Payer: Self-pay

## 2014-03-08 NOTE — Telephone Encounter (Signed)
Pt would like to know the following: 1 - If she starts the Aromasin, does she have to take it for 7 years or can she take it for the 2 years she would have had to do with the tamoxifen?  She does not want to be on oral therapy for 10 years.   2 - If 7 years is required, she does not want to take Aromasin.  She would like to go back on the Tamoxifen. 3 - If she goes back on Tamoxifen, can she take the Paxil for the hot flashes?  Pt states she has not taken Taxol for the last week and has not started the Aromasin.    Printed and given to GM for further direction.

## 2014-03-08 NOTE — Telephone Encounter (Signed)
LMOVM - pt to return call to clinic.   

## 2014-03-22 ENCOUNTER — Telehealth: Payer: Self-pay | Admitting: *Deleted

## 2014-03-22 NOTE — Telephone Encounter (Signed)
This RN left message on pt's identified VM per mobile number to return call to this RN per her inquiries- per Dr Aleene Davidson ' s continued absence- questions can be answered by Dr Jana Hakim.

## 2014-03-24 ENCOUNTER — Telehealth: Payer: Self-pay | Admitting: *Deleted

## 2014-03-24 NOTE — Telephone Encounter (Signed)
Responding to voicemail requesting call back from nurse Val, whom she has been speaking with. Directed to voicemail, left information for patient that she can call back and speak with this nurse for today if she has urgent questions.

## 2014-07-01 ENCOUNTER — Telehealth: Payer: Self-pay | Admitting: Hematology and Oncology

## 2014-07-01 NOTE — Telephone Encounter (Signed)
, °

## 2014-07-05 ENCOUNTER — Ambulatory Visit: Payer: BC Managed Care – PPO | Admitting: Oncology

## 2014-07-05 ENCOUNTER — Other Ambulatory Visit: Payer: BC Managed Care – PPO

## 2014-07-20 ENCOUNTER — Other Ambulatory Visit: Payer: Self-pay

## 2014-07-20 ENCOUNTER — Telehealth: Payer: Self-pay | Admitting: Hematology and Oncology

## 2014-07-20 DIAGNOSIS — C50919 Malignant neoplasm of unspecified site of unspecified female breast: Secondary | ICD-10-CM

## 2014-07-20 NOTE — Telephone Encounter (Signed)
, °

## 2014-07-21 ENCOUNTER — Other Ambulatory Visit: Payer: BC Managed Care – PPO

## 2014-07-21 ENCOUNTER — Ambulatory Visit: Payer: BC Managed Care – PPO | Admitting: Hematology and Oncology

## 2014-07-22 ENCOUNTER — Other Ambulatory Visit: Payer: Self-pay

## 2014-07-23 ENCOUNTER — Telehealth: Payer: Self-pay | Admitting: Hematology and Oncology

## 2014-07-23 NOTE — Telephone Encounter (Signed)
, °

## 2014-08-23 ENCOUNTER — Other Ambulatory Visit: Payer: BC Managed Care – PPO

## 2014-08-23 ENCOUNTER — Ambulatory Visit: Payer: BC Managed Care – PPO | Admitting: Hematology and Oncology

## 2014-08-23 ENCOUNTER — Telehealth: Payer: Self-pay | Admitting: Hematology and Oncology

## 2014-11-01 ENCOUNTER — Other Ambulatory Visit: Payer: Self-pay | Admitting: Hematology and Oncology

## 2014-11-01 DIAGNOSIS — Z853 Personal history of malignant neoplasm of breast: Secondary | ICD-10-CM

## 2014-11-01 DIAGNOSIS — Z9889 Other specified postprocedural states: Secondary | ICD-10-CM

## 2014-11-29 ENCOUNTER — Ambulatory Visit
Admission: RE | Admit: 2014-11-29 | Discharge: 2014-11-29 | Disposition: A | Discharge status: Home / Self Care | Payer: BLUE CROSS/BLUE SHIELD | Source: Ambulatory Visit | Attending: Hematology and Oncology | Admitting: Hematology and Oncology

## 2014-11-29 DIAGNOSIS — Z9889 Other specified postprocedural states: Secondary | ICD-10-CM

## 2014-11-29 DIAGNOSIS — Z853 Personal history of malignant neoplasm of breast: Secondary | ICD-10-CM

## 2015-05-20 ENCOUNTER — Other Ambulatory Visit: Payer: Self-pay | Admitting: *Deleted

## 2015-05-23 ENCOUNTER — Telehealth: Payer: Self-pay | Admitting: Hematology

## 2015-05-23 NOTE — Telephone Encounter (Signed)
Returning patients call per staff message.  Advised her that she had been reassigned to dr Lindi Adie but she req a female.  I have placed her with dr Burr Medico.    anne

## 2015-06-20 ENCOUNTER — Encounter: Payer: BLUE CROSS/BLUE SHIELD | Admitting: Hematology

## 2015-06-20 ENCOUNTER — Encounter: Payer: Self-pay | Admitting: Hematology

## 2015-06-20 NOTE — Progress Notes (Signed)
No show  This encounter was created in error - please disregard.

## 2015-06-21 ENCOUNTER — Telehealth: Payer: Self-pay | Admitting: Hematology

## 2015-06-21 ENCOUNTER — Other Ambulatory Visit: Payer: Self-pay | Admitting: *Deleted

## 2015-06-21 NOTE — Telephone Encounter (Signed)
per pof to r/s appt-cld pt and r/s appt-pt req a early morning and Monday-works 2 jobs-sch appt per pt req-left [facility] appt

## 2015-07-06 ENCOUNTER — Ambulatory Visit: Payer: BLUE CROSS/BLUE SHIELD | Admitting: Hematology

## 2015-07-18 ENCOUNTER — Telehealth: Payer: Self-pay | Admitting: *Deleted

## 2015-07-18 ENCOUNTER — Encounter: Payer: Self-pay | Admitting: Hematology

## 2015-07-18 ENCOUNTER — Encounter: Payer: BLUE CROSS/BLUE SHIELD | Admitting: Hematology

## 2015-07-18 NOTE — Telephone Encounter (Signed)
Message left on pt's identified vm to call back to r/s missed appt from today.

## 2015-07-18 NOTE — Progress Notes (Signed)
No show, will send her a letter.    This encounter was created in error - please disregard.

## 2015-07-19 ENCOUNTER — Telehealth: Payer: Self-pay | Admitting: Hematology

## 2015-07-19 NOTE — Telephone Encounter (Signed)
Returned patient call re cancelling 9/26 appointment. Per patient she will call back to r/s.

## 2015-11-21 ENCOUNTER — Other Ambulatory Visit: Payer: Self-pay | Admitting: Obstetrics and Gynecology

## 2015-11-21 DIAGNOSIS — Z853 Personal history of malignant neoplasm of breast: Secondary | ICD-10-CM

## 2015-11-21 DIAGNOSIS — Z9889 Other specified postprocedural states: Secondary | ICD-10-CM

## 2015-12-12 ENCOUNTER — Ambulatory Visit
Admission: RE | Admit: 2015-12-12 | Discharge: 2015-12-12 | Disposition: A | Discharge status: Home / Self Care | Payer: BLUE CROSS/BLUE SHIELD | Source: Ambulatory Visit | Attending: Obstetrics and Gynecology | Admitting: Obstetrics and Gynecology

## 2015-12-12 DIAGNOSIS — Z853 Personal history of malignant neoplasm of breast: Secondary | ICD-10-CM

## 2015-12-12 DIAGNOSIS — Z9889 Other specified postprocedural states: Secondary | ICD-10-CM

## 2016-02-27 ENCOUNTER — Other Ambulatory Visit: Payer: Self-pay | Admitting: Obstetrics and Gynecology

## 2016-02-27 DIAGNOSIS — R928 Other abnormal and inconclusive findings on diagnostic imaging of breast: Secondary | ICD-10-CM

## 2016-03-05 ENCOUNTER — Inpatient Hospital Stay: Admission: RE | Admit: 2016-03-05 | Payer: BLUE CROSS/BLUE SHIELD | Source: Ambulatory Visit

## 2017-01-21 ENCOUNTER — Other Ambulatory Visit: Payer: Self-pay | Admitting: General Practice

## 2017-01-21 DIAGNOSIS — Z853 Personal history of malignant neoplasm of breast: Secondary | ICD-10-CM

## 2017-02-04 ENCOUNTER — Ambulatory Visit
Admission: RE | Admit: 2017-02-04 | Discharge: 2017-02-04 | Disposition: A | Discharge status: Home / Self Care | Payer: BLUE CROSS/BLUE SHIELD | Source: Ambulatory Visit | Attending: General Practice | Admitting: General Practice

## 2017-02-04 DIAGNOSIS — Z853 Personal history of malignant neoplasm of breast: Secondary | ICD-10-CM

## 2017-12-30 ENCOUNTER — Other Ambulatory Visit: Payer: Self-pay | Admitting: General Practice

## 2017-12-30 DIAGNOSIS — Z9889 Other specified postprocedural states: Secondary | ICD-10-CM

## 2018-02-21 ENCOUNTER — Other Ambulatory Visit: Payer: Self-pay | Admitting: General Practice

## 2018-02-21 DIAGNOSIS — Z1231 Encounter for screening mammogram for malignant neoplasm of breast: Secondary | ICD-10-CM

## 2018-02-24 ENCOUNTER — Ambulatory Visit
Admission: RE | Admit: 2018-02-24 | Discharge: 2018-02-24 | Disposition: A | Discharge status: Home / Self Care | Payer: BLUE CROSS/BLUE SHIELD | Source: Ambulatory Visit | Attending: General Practice | Admitting: General Practice

## 2018-02-24 DIAGNOSIS — Z1231 Encounter for screening mammogram for malignant neoplasm of breast: Secondary | ICD-10-CM

## 2018-08-26 DIAGNOSIS — Z0279 Encounter for issue of other medical certificate: Secondary | ICD-10-CM

## 2018-12-02 ENCOUNTER — Other Ambulatory Visit: Payer: Self-pay | Admitting: General Practice

## 2018-12-02 DIAGNOSIS — Z1231 Encounter for screening mammogram for malignant neoplasm of breast: Secondary | ICD-10-CM

## 2019-01-12 ENCOUNTER — Other Ambulatory Visit (HOSPITAL_COMMUNITY): Payer: Self-pay | Admitting: *Deleted

## 2019-01-12 DIAGNOSIS — Z1231 Encounter for screening mammogram for malignant neoplasm of breast: Secondary | ICD-10-CM

## 2019-03-02 ENCOUNTER — Ambulatory Visit: Payer: BLUE CROSS/BLUE SHIELD

## 2019-04-14 ENCOUNTER — Ambulatory Visit (HOSPITAL_COMMUNITY)
Admission: RE | Admit: 2019-04-14 | Discharge: 2019-04-14 | Disposition: A | Discharge status: Home / Self Care | Payer: Self-pay | Source: Ambulatory Visit | Attending: Obstetrics and Gynecology | Admitting: Obstetrics and Gynecology

## 2019-04-14 ENCOUNTER — Encounter (HOSPITAL_COMMUNITY): Payer: Self-pay

## 2019-04-14 ENCOUNTER — Ambulatory Visit
Admission: RE | Admit: 2019-04-14 | Discharge: 2019-04-14 | Disposition: A | Discharge status: Home / Self Care | Payer: Self-pay | Source: Ambulatory Visit | Attending: Obstetrics and Gynecology | Admitting: Obstetrics and Gynecology

## 2019-04-14 ENCOUNTER — Other Ambulatory Visit: Payer: Self-pay

## 2019-04-14 DIAGNOSIS — Z0000 Encounter for general adult medical examination without abnormal findings: Secondary | ICD-10-CM | POA: Insufficient documentation

## 2019-04-14 DIAGNOSIS — Z1231 Encounter for screening mammogram for malignant neoplasm of breast: Secondary | ICD-10-CM

## 2019-04-14 DIAGNOSIS — Z1239 Encounter for other screening for malignant neoplasm of breast: Secondary | ICD-10-CM

## 2019-04-14 NOTE — Patient Instructions (Signed)
Explained breast self awareness with [name]. Patient did not need a Pap smear today due to last Pap smear was 01/01/2019 per patient. Let her know [facility] will cover Pap smears every 3 years unless has a history of abnormal Pap smears. Referred patient to the [facility] for a screening mammogram. Appointment scheduled for Tuesday, April 14, 2019 at 1110. Patient aware of appointment and will be there. Let patient know the [facility] will follow up with her within the next couple weeks with results of mammogram by letter or phone. Shawntel Farnworth Gabay verbalized understanding.  [place], Arvil Chaco, RN 10:17 AM

## 2019-04-14 NOTE — Progress Notes (Signed)
No complaints today.   Pap Smear: Pap smear not completed today. Last Pap smear was 01/01/2019 by Charlyne Petrin at her PCP's office in [facility] and normal per patient. Per patient has no history of an abnormal Pap smear. Last Pap smear result is not in Epic. Previous Pap smear result 01/20/2018 is in [place].  Physical exam: Breasts Right breast slightly larger than left breast due to patient has a history of a left breast lumpectomy in 2011 for breast cancer. Scar observed left upper breast due to lumpectomy in 2011. No skin abnormalities observed right breast. No nipple retraction bilateral breasts. No nipple discharge bilateral breasts. No lymphadenopathy. No lumps palpated bilateral breasts. No complaints of pain or tenderness on exam. Referred patient to the [facility] for a screening mammogram. Appointment scheduled for Tuesday, April 14, 2019 at 1110.        Pelvic/Bimanual No Pap smear completed today since last Pap smear was in March 2020 per patient. Pap smear not indicated per [facility] guidelines.   Smoking History: Patient has never smoked.  Patient Navigation: Patient education provided. Access to services provided for patient through [facility] program.   Colorectal Cancer Screening: Per patient has never had a colonoscopy completed. No complaints today.   Breast and Cervical Cancer Risk Assessment: Patient has a family history of her paternal grandmother having breast cancer. Patient has a personal history of breast cancer in 2011. Patient has no known genetic mutations or history of radiation treatment to the chest before age 37. Patient has no history of cervical dysplasia, immunocompromised, or DES exposure in-utero.  Risk Assessment    Risk Scores      04/14/2019   Last edited by: Loletta Parish, RN   5-year risk: 1.1 %   Lifetime risk: 5.9 %

## 2019-04-15 ENCOUNTER — Encounter: Payer: Self-pay | Admitting: *Deleted

## 2019-04-15 ENCOUNTER — Other Ambulatory Visit: Payer: Self-pay | Admitting: Obstetrics and Gynecology

## 2019-04-15 DIAGNOSIS — R921 Mammographic calcification found on diagnostic imaging of breast: Secondary | ICD-10-CM

## 2019-04-20 ENCOUNTER — Other Ambulatory Visit: Payer: Self-pay | Admitting: Obstetrics and Gynecology

## 2019-04-20 ENCOUNTER — Other Ambulatory Visit: Payer: Self-pay

## 2019-04-20 ENCOUNTER — Ambulatory Visit
Admission: RE | Admit: 2019-04-20 | Discharge: 2019-04-20 | Disposition: A | Discharge status: Home / Self Care | Payer: No Typology Code available for payment source | Source: Ambulatory Visit | Attending: Obstetrics and Gynecology | Admitting: Obstetrics and Gynecology

## 2019-04-20 DIAGNOSIS — R921 Mammographic calcification found on diagnostic imaging of breast: Secondary | ICD-10-CM

## 2019-05-04 ENCOUNTER — Other Ambulatory Visit: Payer: Self-pay

## 2019-05-04 ENCOUNTER — Inpatient Hospital Stay: Payer: No Typology Code available for payment source | Attending: Obstetrics and Gynecology | Admitting: *Deleted

## 2019-05-04 VITALS — BP 126/84 | Temp 97.9°F | Ht 65.0 in | Wt 165.0 lb

## 2019-05-04 DIAGNOSIS — Z0000 Encounter for general adult medical examination without abnormal findings: Secondary | ICD-10-CM

## 2019-05-04 NOTE — Progress Notes (Signed)
Wisewoman initial screening    Clinical Measurement:  Height: 65in Weight: 165 lb  Blood Pressure: 136/96  Blood Pressure #2: 126/84 Fasting Labs Drawn Today, will review with patient when they result.   Medical History:  Patient states that she does not have a history of high cholesterol, blood pressure or diabetes.  Medications: Patient states that she not currently taking any medications to lower cholesterol, blood pressure or blood sugar. Patient states that she is not currently taking an aspirin daily to prevent a heart attack or stroke.   Blood pressure, self measurement: Patient states that she measures blood pressure weekly and does not share blood pressure readings with a health care provider.    Nutrition: Patient states that on an average day she consumes 1 cup of fruit and 2 cups of vegetables in an average day. Patient states that she does not eat fish at least 2 times a week. Patient states about half of grain products that the she consumes on a typical day are whole grains. Patient drinks less than 36 ounces of beverages with added sugars weekly and is currently watching sodium or salt intake. In the past 7 days patient has had 0 drinks containing alcohol. On average patient drinks 0 drinks per day containing alcohol.   Physical activity: Patient states that she gets 150 minutes of moderate physical activity per week and 275 minutes of vigorous physical activity per week.   Smoking status: Patient states that she has never smoked tobacco.    Quality of life: Over the past 2 weeks patient states that over several days she has had little interest or pleasure in doing things and over several days she has felt down, depressed or hopeless.    Risk reduction and counseling:  Patient states that she would like to work on her diet in order to loose some weight and eat healthier. Spoke with patient about increasing her servings of fruit from 1 serving to 2 servings per day and increasing her  vegetables from 2 servings to 3 servings per day. Also spoke with patient about adding heart healthy fish to her diet. We also talked about portion control and reducing the amount of sodium in diet. Informed patient that we would wait until we received her lab results back to see if any other changes should be made to her diet.    Navigation:  I will notify patient of lab results.  Patient is aware of 2 more health coaching sessions and a follow up.  Time: 35 minutes

## 2019-05-05 LAB — LIPID PANEL W/O CHOL/HDL RATIO
Cholesterol, Total: 196 mg/dL (ref 100–199)
HDL: 78 mg/dL (ref >39)
LDL Calculated: 110 mg/dL — ABNORMAL HIGH (ref 0–99)
Triglycerides: 38 mg/dL (ref 0–149)
VLDL Cholesterol Cal: 8 mg/dL (ref 5–40)

## 2019-05-05 LAB — GLUCOSE, RANDOM: Glucose: 120 mg/dL — ABNORMAL HIGH (ref 65–99)

## 2019-05-05 LAB — HGB A1C W/O EAG: Hgb A1c MFr Bld: 5.9 % — ABNORMAL HIGH (ref 4.8–5.6)

## 2019-05-14 ENCOUNTER — Telehealth: Payer: Self-pay

## 2019-05-14 NOTE — Telephone Encounter (Signed)
Health coaching 2      Labs- 196 cholesterol , 110 LDL cholesterol , 38 triglycerides , 78 HDL cholesterol , 5.9 hemoglobin A1C , 120 mean plasma glucose.  Patient understands and is aware of her lab results.   Goals-  Went over lab results with patient and answered any questions patient had. Goals for patient is to work on reducing the amount of sweets and desserts in diet. Also to work on limiting and reducing the amount of carbohydrates in diet. Spoke with patient about portion control and using measuring cups to measure out food. Patient has been trying to add more fruits and vegetables into diet as well. Encouraged patient to continue with daily exercise and walking.   Navigation:  Patient is aware of 1 more health coaching sessions and a follow up. Patient is scheduled for follow-up at [facility] on May 18, 2019 @ 10:15 am.  Time-  11 minutes

## 2019-05-18 ENCOUNTER — Ambulatory Visit (INDEPENDENT_AMBULATORY_CARE_PROVIDER_SITE_OTHER): Payer: Self-pay | Admitting: Internal Medicine

## 2019-05-18 ENCOUNTER — Encounter: Payer: Self-pay | Admitting: Internal Medicine

## 2019-05-18 ENCOUNTER — Other Ambulatory Visit: Payer: Self-pay

## 2019-05-18 DIAGNOSIS — E789 Disorder of lipoprotein metabolism, unspecified: Secondary | ICD-10-CM

## 2019-05-18 DIAGNOSIS — Z0000 Encounter for general adult medical examination without abnormal findings: Secondary | ICD-10-CM

## 2019-05-18 DIAGNOSIS — Z9221 Personal history of antineoplastic chemotherapy: Secondary | ICD-10-CM

## 2019-05-18 DIAGNOSIS — R739 Hyperglycemia, unspecified: Secondary | ICD-10-CM | POA: Insufficient documentation

## 2019-05-18 DIAGNOSIS — Z923 Personal history of irradiation: Secondary | ICD-10-CM

## 2019-05-18 DIAGNOSIS — Z853 Personal history of malignant neoplasm of breast: Secondary | ICD-10-CM

## 2019-05-18 LAB — GLUCOSE, CAPILLARY: Glucose-Capillary: 100 mg/dL — ABNORMAL HIGH (ref 70–99)

## 2019-05-18 NOTE — Assessment & Plan Note (Signed)
On 7/13 fasting labs, patient found to have elevated blood glucose of 120 with HbA1c 5.9.  She was recommended lifestyle modifications which she has been following. Patient reports she has decreased her sugar intake. She no longer drinks soft drinks and only has ice cream once a week. She reports a 3lb associated weight loss and increased energy levels. Patient reports she feels good and exercises daily.  Fasting Capillary Glucose - 100 today.   - Continue lifestyle modifications  - HbA1c in 3 months

## 2019-05-18 NOTE — Assessment & Plan Note (Signed)
Fasting labs: Total cholesterol 196, LDL 110, HDL 78, Triglycerides 38 ASCVD score: 5.63%  No personal history of hypertension or diabetes. Patient is non-smoker. No family history of CAD.  Patient is currently following lifestyle and dietary modifications.   - Continue to monitor - Recommend statin therapy if cholesterol still elevated despite lifestyle modifications

## 2019-05-18 NOTE — Assessment & Plan Note (Signed)
Mammogram - as per patient, mammogram within last year showing minor calcifications. She reports she is following up for this.   Pap smear - completed in March 2020, no abnormal cytology noted Colonoscopy - completed within past 10 years  No DEXA scan in past two years.  Patient reports she is up to date with vaccinations  - DEXA scan planning at next visit

## 2019-05-18 NOTE — Patient Instructions (Addendum)
[name],   It was a pleasure meeting you in clinic today. Today, we discussed your blood glucose level and blood pressure.   Fasting blood glucose today - 100  Keep up the great work you've been doing! Continue to exercise and incorporate fruits and vegetables into your diet and decreasing your sugar intake.   We will follow up in 3 months.  Please contact us if you have any concerns.  Thank you!

## 2019-05-18 NOTE — Progress Notes (Signed)
   CC: elevated blood glucose   HPI:  [name] is a 58 y.o. female w/PMHx of breast cancer s/p lumpectomy and chemo/radiation in 2011 presenting to clinic for follow up of elevated blood glucose levels. She was evaluated on 7/13 for Wisewoman initial screening. Fasting labs revealed elevated LDL  and elevated plasma glucose. Patient is in no acute distress. Please see problem based charting for further assessment and plan.    PMHx: breast cancer (left sided) s/p lumpectomy, chemotherapy and radiation in 2011  Family Hx:  Breast cancer - paternal grandmother Diabetes - sister Hypertension - father  Social Hx: Patient is currently unemployed. She denies any smoking, alcohol or tobacco use at this time.   Surgical Hx: Lumpectomy in 2011  Past Medical History:  Diagnosis Date  . Breast cancer [facility]) Oct 2011   left  . Peripheral neuropathy    Review of Systems:  Review of Systems  Constitutional: Negative for chills, fever and malaise/fatigue.  Respiratory: Negative for cough, shortness of breath and wheezing.   Cardiovascular: Negative for chest pain, palpitations, claudication and leg swelling.  Gastrointestinal: Negative for abdominal pain, constipation, diarrhea, nausea and vomiting.  Genitourinary: Negative for dysuria, frequency and urgency.  Musculoskeletal: Negative for back pain, joint pain and myalgias.  Neurological: Negative for dizziness, sensory change, weakness and headaches.     Physical Exam:  Vitals:   05/18/19 1018 05/18/19 1024  BP: (!) 131/93 110/90  Pulse: 79 74  Temp: 98.3 F (36.8 C)   SpO2: 100%   Weight: 162 lb 3.2 oz (73.6 kg)   Height: 5\' 5"  (1.651 m)    Physical Exam  Constitutional: She is oriented to person, place, and time and well-developed, well-nourished, and in no distress.  Neck: Normal range of motion. Neck supple. No thyromegaly present.  Cardiovascular: Normal rate, regular rhythm, normal heart sounds and intact distal  pulses. Exam reveals no gallop and no friction rub.  No murmur heard. Pulmonary/Chest: Effort normal and breath sounds normal. No respiratory distress. She has no wheezes. She has no rales.  Abdominal: Soft. Bowel sounds are normal. She exhibits no distension. There is no abdominal tenderness.  Musculoskeletal: Normal range of motion.        General: No tenderness, deformity or edema.  Lymphadenopathy:    She has no cervical adenopathy.  Neurological: She is alert and oriented to person, place, and time.     Assessment & Plan:   See Encounters Tab for problem based charting.  Patient seen with Dr. Daryll Drown

## 2019-05-18 NOTE — Progress Notes (Signed)
Internal Medicine Clinic Attending  I saw and evaluated the patient.  I personally confirmed the key portions of the history and exam documented by Dr. Aslam and I reviewed pertinent patient test results.  The assessment, diagnosis, and plan were formulated together and I agree with the documentation in the resident's note.     

## 2019-06-03 ENCOUNTER — Ambulatory Visit: Payer: No Typology Code available for payment source

## 2019-06-17 ENCOUNTER — Telehealth: Payer: Self-pay

## 2019-06-17 NOTE — Telephone Encounter (Signed)
Health Coaching 3    Goals-  Patient has been watching diet and exercise. She has been trying to watch how many and how often she eats sweets. Patient has been eating brown rice for whole grains. Encouraged patient to continue with exercise routine.    New goal- To consume 2 fruits and 3 vegetables per day.   Barrier to reaching goal- none   Strategies to overcome- none   Navigation:  Patient is aware of  a follow up session. Patient is scheduled for follow-up appointment on October 5th @ 9:00 am   Time- 10 minutes

## 2019-07-06 ENCOUNTER — Ambulatory Visit: Payer: No Typology Code available for payment source

## 2019-07-06 ENCOUNTER — Telehealth: Payer: Self-pay | Admitting: Obstetrics and Gynecology

## 2019-07-27 ENCOUNTER — Inpatient Hospital Stay: Payer: No Typology Code available for payment source | Attending: Obstetrics and Gynecology | Admitting: *Deleted

## 2019-07-27 ENCOUNTER — Other Ambulatory Visit: Payer: Self-pay

## 2019-07-27 VITALS — BP 113/83 | Temp 98.0°F | Ht 65.0 in | Wt 160.0 lb

## 2019-07-27 DIAGNOSIS — Z0000 Encounter for general adult medical examination without abnormal findings: Secondary | ICD-10-CM

## 2019-07-27 NOTE — Progress Notes (Signed)
[facility] follow up   Clinical Measurement:  Height: 65 in Weight: 160 lb  Blood Pressure: 141/88  Blood Pressure #2: 113/83    Medical History:  Patient states that she does not have a history of high cholesterol, blood pressure or diabetes.   Medications:  Patient states that she does not take medication to lower cholesterol, blood pressure or diabetes. Patient does not take an aspirin a day to help prevent a heart attack or stroke.   Blood pressure, self measurement:  Patient states that she measures blood pressure from home weekly. Patient does not share blood pressure readings with a healthcare provider.    Nutrition:  Patient states that on average she eats 1 cup of fruit and 2 cups of vegetables per day. Patient states that she does not eat fish at least 2 times per week. In a typical day patient eats more than half servings of whole grains. Patient drinks less than 36 ounces of beverages with added sugar weekly. Patient is currently watching sodium or salt intake. In the past 7 days patient has not had any drinks containing alcohol. On average patient does not drink any drinks containing alcohol.       Physical activity:  Patient states that she gets 60 minutes of moderate and 275 minutes of vigorous physical activity each week.  Smoking status:  Patient states that she has never smoked tobacco.   Quality of life:  Over the past 2 weeks patient states that she has not had any days where she has little interest or pleasure in doing things and several (2 days) days where she has felt down, depressed or hopeless.   Health Coaching: Spoke briefly with the patient about trying to add in an extra serving of fruit and vegetables into daily diet. Explained that the recommendation is for 2 cups of fruit and 3 cups of vegetables per day. Also encouraged patient to try and add an extra serving of heart healthy fish into diet weekly.   Navigation: This was the  follow up session for this patient, I  will check up on her progress in the coming months.  Time: 15 minutes

## 2019-10-21 ENCOUNTER — Other Ambulatory Visit: Payer: Self-pay

## 2019-10-21 ENCOUNTER — Other Ambulatory Visit: Payer: Self-pay | Admitting: Obstetrics and Gynecology

## 2019-10-21 ENCOUNTER — Ambulatory Visit
Admission: RE | Admit: 2019-10-21 | Discharge: 2019-10-21 | Disposition: A | Discharge status: Home / Self Care | Payer: No Typology Code available for payment source | Source: Ambulatory Visit | Attending: Obstetrics and Gynecology | Admitting: Obstetrics and Gynecology

## 2019-10-21 DIAGNOSIS — R921 Mammographic calcification found on diagnostic imaging of breast: Secondary | ICD-10-CM

## 2019-11-25 ENCOUNTER — Other Ambulatory Visit (HOSPITAL_COMMUNITY): Payer: Self-pay

## 2019-11-25 DIAGNOSIS — N6320 Unspecified lump in the left breast, unspecified quadrant: Secondary | ICD-10-CM

## 2020-04-21 ENCOUNTER — Ambulatory Visit
Admission: RE | Admit: 2020-04-21 | Discharge: 2020-04-21 | Disposition: A | Discharge status: Home / Self Care | Payer: No Typology Code available for payment source | Source: Ambulatory Visit | Attending: Obstetrics and Gynecology | Admitting: Obstetrics and Gynecology

## 2020-04-21 ENCOUNTER — Ambulatory Visit: Payer: Self-pay | Admitting: *Deleted

## 2020-04-21 ENCOUNTER — Other Ambulatory Visit: Payer: Self-pay

## 2020-04-21 VITALS — BP 136/94 | Temp 97.7°F | Wt 150.4 lb

## 2020-04-21 DIAGNOSIS — R921 Mammographic calcification found on diagnostic imaging of breast: Secondary | ICD-10-CM

## 2020-04-21 DIAGNOSIS — Z1239 Encounter for other screening for malignant neoplasm of breast: Secondary | ICD-10-CM

## 2020-04-21 NOTE — Progress Notes (Signed)
[name] is a 59 y.o. female who presents to [facility] clinic today with no complaints.    Pap Smear: Pap not smear completed today. Last Pap smear was 01/01/2019 at her PCP's office in [facility] and was normal per patient. Per patient has no history of an abnormal Pap smear. The most recent Pap smear result is not available in Epic but her Pap smear result from 2019 is available in [place].   Physical exam: Breasts Breasts asymmetrical, right breast bigger than the left, due to a left breast lumpectomy in 2011 for stage 2 breast cancer. No skin abnormalities bilateral breasts. Surgical scars noted on the left breast near the areola. No nipple retraction bilateral breasts. No nipple discharge bilateral breasts. No lymphadenopathy. No lumps palpated bilateral breasts.       Pelvic/Bimanual Pap is not indicated today.    Smoking History: Patient has never smoked.    Patient Navigation: Patient education provided. Access to services provided for patient through [facility] program.   Colorectal Cancer Screening: Per patient she has had a colonoscopy in 2011 in [facility] which was normal. No complaints today. Patient was informed of the importance of colorectal cancer screenings since she is of age and has a history of cancer.    Breast and Cervical Cancer Risk Assessment: Patient does have a family history of breast cancer in her paternal grandmother. No known genetic mutations, or radiation treatment to the chest before age 64. Patient does not have a history of cervical dysplasia, immunocompromised, or DES exposure in-utero.  Risk Assessment    Risk Scores      04/21/2020 04/14/2019   Last edited by: Loletta Parish, RN Brannock, Heath Gold, RN   5-year risk: 1.1 % 1.1 %   Lifetime risk: 5.8 % 5.9 %          A: BCCCP exam without pap smear No complaints today.  P: Referred patient to the [facility] for a diagnostic mammogram. Appointment scheduled April 21, 2020 at 11:40am.  Vania Rea, RN, FNP student 04/21/2020 10:02 AM   Attestation of Supervision of Student:  I confirm that I have verified the information documented in the nurse practitioner student's note and that I have also personally reperformed the history, physical exam and all medical decision making activities.  I have verified that all services and findings are accurately documented in this student's note; and I agree with management and plan as outlined in the documentation. I have also made any necessary editorial changes.  Patient referred to [facility] by the [facility] due to recommending 43-month left breast diagnostic mammogram for follow-up. Last bilateral diagnostic mammogram completed 10/21/2019. No complaints of pain or tenderness on exam. Patient has a personal history of breast cancer in 2011.  Br[place]ock, [place] for [employer], [employer] 04/21/2020 12:08 PM

## 2020-04-21 NOTE — Patient Instructions (Addendum)
Informed [name] about breast self awareness. Patient did not need a Pap smear today due to last Pap smear was 01/01/2019 per patient. Let her know B[facility]CP will cover Pap smears every 3 years unless has a history of abnormal Pap smears. Referred patient to the [facility] for diagnostic mammogram. Appointment scheduled for April 21, 2020 at 11:40am. Patient aware of appointment and will be there. Tylin Stradley Ricke verbalized understanding.  Vania Rea, RN, FNP student 10:16 AM

## 2021-05-29 ENCOUNTER — Other Ambulatory Visit: Payer: Self-pay | Admitting: Obstetrics and Gynecology

## 2021-05-29 DIAGNOSIS — Z1231 Encounter for screening mammogram for malignant neoplasm of breast: Secondary | ICD-10-CM

## 2021-06-22 ENCOUNTER — Other Ambulatory Visit: Payer: Self-pay | Admitting: Obstetrics and Gynecology

## 2021-06-22 DIAGNOSIS — Z1231 Encounter for screening mammogram for malignant neoplasm of breast: Secondary | ICD-10-CM

## 2021-07-06 ENCOUNTER — Other Ambulatory Visit: Payer: Self-pay

## 2021-07-06 ENCOUNTER — Ambulatory Visit
Admission: RE | Admit: 2021-07-06 | Discharge: 2021-07-06 | Disposition: A | Discharge status: Home / Self Care | Payer: No Typology Code available for payment source | Source: Ambulatory Visit | Attending: Obstetrics and Gynecology | Admitting: Obstetrics and Gynecology

## 2021-07-06 ENCOUNTER — Ambulatory Visit: Payer: Self-pay | Admitting: *Deleted

## 2021-07-06 VITALS — BP 124/82 | Wt 159.3 lb

## 2021-07-06 DIAGNOSIS — Z1231 Encounter for screening mammogram for malignant neoplasm of breast: Secondary | ICD-10-CM

## 2021-07-06 DIAGNOSIS — Z1239 Encounter for other screening for malignant neoplasm of breast: Secondary | ICD-10-CM

## 2021-07-06 NOTE — Progress Notes (Signed)
[name] is a 60 y.o. female who presents to [facility] clinic today with no complaints.    Pap Smear: Pap smear not completed today. Last Pap smear was 01/06/2019 at [facility] and normal with negative HPV. Per patient has no history of an abnormal Pap smear. Last Pap smear result is available in Epic.    Physical exam: Breasts Right breast slightly larger than left breast due to patient has a history of a left breast lumpectomy in 2011 for breast cancer. Scar observed left upper breast due to lumpectomy in 2011. No skin abnormalities observed right breast. No nipple retraction bilateral breasts. No nipple discharge bilateral breasts. No lymphadenopathy. No lumps palpated bilateral breasts. No complaints of pain or tenderness on exam.  MM Digital Diagnostic Unilat L  Result Date: 04/20/2019 CLINICAL DATA:  61 year old female for further evaluation of LEFT breast calcifications on screening mammogram. History of LEFT breast cancer and lumpectomy in 2012. EXAM: DIGITAL DIAGNOSTIC LEFT MAMMOGRAM WITH CAD COMPARISON:  Previous exam(s). ACR Breast Density Category c: The breast tissue is heterogeneously dense, which may obscure small masses. FINDINGS: Full field and magnification views of the LEFT breast again demonstrate lumpectomy changes within the UPPER OUTER LEFT breast. Along the anterior aspect of the lumpectomy site, developing calcifications are noted, which have a slightly dystrophic appearance. These calcifications span a distance of 5 mm. Adjacent large bulky dystrophic calcifications have further developed and calcified since recent mammograms Mammographic images were processed with CAD. IMPRESSION: 1. Developing calcifications at the LEFT lumpectomy site, with appearance most likely representing dystrophic calcifications. Options of tissue sampling and short-term follow-up were presented. The patient does not desire tissue sampling at this time.  RECOMMENDATION: LEFT diagnostic mammogram with magnification views in 6 months. I have discussed the findings and recommendations with the patient. If applicable, a reminder letter will be sent to the patient regarding the next appointment. BI-RADS CATEGORY  3: Probably benign. Electronically Signed   By: Margarette Canada M.D.   On: 04/20/2019 11:04   MM DIGITAL SCREENING BILATERAL  Result Date: 02/24/2018 CLINICAL DATA:  Screening. History of LEFT breast cancer and LEFT lumpectomy in 2012. EXAM: DIGITAL SCREENING BILATERAL MAMMOGRAM WITH CAD COMPARISON:  Previous exam(s). ACR Breast Density Category c: The breast tissue is heterogeneously dense, which may obscure small masses. FINDINGS: There are no findings suspicious for malignancy. LEFT breast lumpectomy changes are again noted. Images were processed with CAD. IMPRESSION: No mammographic evidence of malignancy. A result letter of this screening mammogram will be mailed directly to the patient. RECOMMENDATION: Screening mammogram in one year. (Code:SM-B-01Y) BI-RADS CATEGORY  2: Benign. Electronically Signed   By: Margarette Canada M.D.   On: 02/24/2018 09:56   MM DIAG BREAST TOMO UNI LEFT  Result Date: 10/21/2019 CLINICAL DATA:  Short-term follow-up for probably benign calcifications developing in the patient's lumpectomy bed, initially evaluated on 04/20/2019. Patient has a history of left breast carcinoma diagnosed and treated in 2011. EXAM: DIGITAL DIAGNOSTIC UNILATERAL LEFT MAMMOGRAM WITH CAD AND TOMO COMPARISON:  Previous exam(s). ACR Breast Density Category c: The breast tissue is heterogeneously dense, which may obscure small masses. FINDINGS: Calcifications within the center of the lumpectomy bed have mildly increased from the prior study, but are arranged in a curvilinear fashion surrounding an area of fat attenuation at the center of the lumpectomy site, strongly suggesting fat necrosis calcifications. There are no masses. There are no areas of  nonsurgical architectural distortion. Mammographic images were processed with  CAD. IMPRESSION: 1. Probably benign left breast calcifications at the center of the lumpectomy bed, consistent with dystrophic/fat necrosis calcifications. Additional short-term follow-up recommended. RECOMMENDATION: Diagnostic mammography with left breast magnification views in 6 months. I have discussed the findings and recommendations with the patient. If applicable, a reminder letter will be sent to the patient regarding the next appointment. BI-RADS CATEGORY  3: Probably benign. Electronically Signed   By: Lajean Manes M.D.   On: 10/21/2019 12:07   MM DIAG BREAST TOMO BILATERAL  Result Date: 04/21/2020 CLINICAL DATA:  Follow-up probably benign dystrophic calcifications in the left breast lumpectomy bed. Status post left lumpectomy for breast cancer in 2011. EXAM: DIGITAL DIAGNOSTIC BILATERAL MAMMOGRAM WITH TOMO AND CAD COMPARISON:  Previous exam(s). ACR Breast Density Category c: The breast tissue is heterogeneously dense, which may obscure small masses. FINDINGS: Post lumpectomy changes are again demonstrated on the left. Indeterminate calcifications seen at that location on 04/20/2019 have coalesced into typically benign dystrophic calcifications compatible with fat necrosis. No interval findings suspicious for malignancy in either breast. Mammographic images were processed with CAD. IMPRESSION: 1. Benign left breast calcifications associated with fat necrosis at the lumpectomy site. 2. No evidence of malignancy in either breast. RECOMMENDATION: Bilateral screening mammogram in 1 year. I have discussed the findings and recommendations with the patient. If applicable, a reminder letter will be sent to the patient regarding the next appointment. BI-RADS CATEGORY  2: Benign. Electronically Signed   By: Claudie Revering M.D.   On: 04/21/2020 12:27   MM DIAG BREAST TOMO BILATERAL  Result Date: 02/04/2017 CLINICAL DATA:  60 year old  female for annual bilateral mammograms. History of left breast cancer and lumpectomy in 2012. EXAM: 2D DIGITAL DIAGNOSTIC BILATERAL MAMMOGRAM WITH CAD AND ADJUNCT TOMO COMPARISON:  Previous exam(s). ACR Breast Density Category c: The breast tissue is heterogeneously dense, which may obscure small masses. FINDINGS: 2D and 3D full field views of both breasts and a magnification view of the lumpectomy site are performed. There is no evidence of suspicious mass, nonsurgical distortion or worrisome calcifications. Surgical changes within the upper-outer left breast are again identified. Mammographic images were processed with CAD. IMPRESSION: No mammographic evidence of breast malignancy. RECOMMENDATION: Bilateral diagnostic mammograms in 1 year. I have discussed the findings and recommendations with the patient. Results were also provided in writing at the conclusion of the visit. If applicable, a reminder letter will be sent to the patient regarding the next appointment. BI-RADS CATEGORY  2: Benign. Electronically Signed   By: Margarette Canada M.D.   On: 02/04/2017 09:54   MS DIGITAL SCREENING TOMO BILATERAL  Result Date: 04/14/2019 CLINICAL DATA:  Screening. LEFT lumpectomy 2012. EXAM: DIGITAL SCREENING BILATERAL MAMMOGRAM WITH TOMO AND CAD COMPARISON:  Previous exam(s). ACR Breast Density Category c: The breast tissue is heterogeneously dense, which may obscure small masses. FINDINGS: In the left breast, calcifications at the lumpectomy site warrant further evaluation. In the right breast, no findings suspicious for malignancy. Images were processed with CAD. IMPRESSION: Further evaluation is suggested for calcifications in the left breast. RECOMMENDATION: Diagnostic mammogram of the left breast. (Code:[id number]) The patient will be contacted regarding the findings, and additional imaging will be scheduled. BI-RADS CATEGORY  0: Incomplete. Need additional imaging evaluation and/or prior mammograms for comparison.  Electronically Signed   By: Nolon Nations M.D.   On: 04/14/2019 14:31     Pelvic/Bimanual Pap is not indicated today per BCCCP guidelines.   Smoking History: Patient has never smoked.   Patient Navigation:  Patient education provided. Access to services provided for patient through [place] program.   Colorectal Cancer Screening: Per patient had a colonoscopy completed 11 years ago in [facility]. Patient refused FIT Test Today. No complaints today.    Breast and Cervical Cancer Risk Assessment: Patient has a family history of her paternal grandmother having breast cancer. Patient has a personal history of breast cancer in 2011. Patient has no known genetic mutations or history of radiation treatment to the chest before age 35. Patient has no history of cervical dysplasia, immunocompromised, or DES exposure in-utero.  Risk Assessment     Risk Scores       07/06/2021 04/21/2020   Last edited by: Demetrius Revel, LPN Yoshiaki Kreuser, Heath Gold, RN   5-year risk: 1.1 % 1.1 %   Lifetime risk: 5.6 % 5.8 %            A: BCCCP exam without pap smear No complaints.  P: Referred patient to the [facility] for a screening mammogram on mobile unit. Appointment scheduled Thursday, July 06, 2021 at 1000.  Loletta Parish, RN 07/06/2021 9:10 AM

## 2021-07-06 NOTE — Patient Instructions (Signed)
Explained breast self awareness with [name]. Patient did not need a Pap smear today due to last Pap smear and HPV typing was 01/06/2019. [name] Referred patient to the [facility] for a screening mammogram on mobile unit. Appointment scheduled Thursday, July 06, 2021 at 1000. Patient escorted to the mobile unit following BCCCP appointment for her screening mammogram. [name] [name] verbalized understanding.  Tryone Kille, Arvil Chaco, RN 9:10 AM

## 2021-07-13 ENCOUNTER — Other Ambulatory Visit: Payer: Self-pay | Admitting: Obstetrics and Gynecology

## 2021-07-13 DIAGNOSIS — R928 Other abnormal and inconclusive findings on diagnostic imaging of breast: Secondary | ICD-10-CM

## 2021-07-31 ENCOUNTER — Other Ambulatory Visit: Payer: Self-pay

## 2021-07-31 ENCOUNTER — Ambulatory Visit
Admission: RE | Admit: 2021-07-31 | Discharge: 2021-07-31 | Disposition: A | Discharge status: Home / Self Care | Payer: No Typology Code available for payment source | Source: Ambulatory Visit | Attending: Obstetrics and Gynecology | Admitting: Obstetrics and Gynecology

## 2021-07-31 ENCOUNTER — Ambulatory Visit: Payer: No Typology Code available for payment source

## 2021-07-31 DIAGNOSIS — R928 Other abnormal and inconclusive findings on diagnostic imaging of breast: Secondary | ICD-10-CM
